# Patient Record
Sex: Female | Born: 1945
Health system: Southern US, Community
[De-identification: ages and names within clinical notes are randomized; demographics above are authoritative.]

## PROBLEM LIST (undated history)

## (undated) DIAGNOSIS — Z809 Family history of malignant neoplasm, unspecified: Secondary | ICD-10-CM

## (undated) DIAGNOSIS — Z8669 Personal history of other diseases of the nervous system and sense organs: Secondary | ICD-10-CM

## (undated) DIAGNOSIS — E785 Hyperlipidemia, unspecified: Secondary | ICD-10-CM

## (undated) DIAGNOSIS — T7840XA Allergy, unspecified, initial encounter: Secondary | ICD-10-CM

## (undated) DIAGNOSIS — M810 Age-related osteoporosis without current pathological fracture: Secondary | ICD-10-CM

## (undated) DIAGNOSIS — I4891 Unspecified atrial fibrillation: Secondary | ICD-10-CM

## (undated) DIAGNOSIS — H9190 Unspecified hearing loss, unspecified ear: Secondary | ICD-10-CM

## (undated) DIAGNOSIS — R51 Headache: Secondary | ICD-10-CM

## (undated) DIAGNOSIS — R519 Headache, unspecified: Secondary | ICD-10-CM

## (undated) HISTORY — PX: OTHER SURGICAL HISTORY: SHX169

## (undated) HISTORY — DX: Hyperlipidemia, unspecified: E78.5

## (undated) HISTORY — DX: Personal history of other diseases of the nervous system and sense organs: Z86.69

## (undated) HISTORY — DX: Family history of malignant neoplasm, unspecified: Z80.9

## (undated) HISTORY — DX: Allergy, unspecified, initial encounter: T78.40XA

## (undated) HISTORY — DX: Age-related osteoporosis without current pathological fracture: M81.0

## (undated) HISTORY — DX: Unspecified hearing loss, unspecified ear: H91.90

## (undated) HISTORY — DX: Unspecified atrial fibrillation: I48.91

---

## 1967-09-24 HISTORY — PX: TUBAL LIGATION: SHX77

## 1998-07-24 HISTORY — PX: ABDOMINAL HYSTERECTOMY: SHX81

## 2001-04-28 HISTORY — PX: OTHER SURGICAL HISTORY: SHX169

## 2001-06-08 ENCOUNTER — Encounter: Admission: RE | Admit: 2001-06-08 | Discharge: 2001-06-08 | Payer: Self-pay | Admitting: Family Medicine

## 2001-06-08 ENCOUNTER — Encounter: Payer: Self-pay | Admitting: Family Medicine

## 2001-06-08 ENCOUNTER — Other Ambulatory Visit: Admission: RE | Admit: 2001-06-08 | Discharge: 2001-06-08 | Payer: Self-pay | Admitting: Family Medicine

## 2001-06-08 LAB — CONVERTED CEMR LAB: Pap Smear: NORMAL

## 2002-08-16 ENCOUNTER — Encounter: Payer: Self-pay | Admitting: Family Medicine

## 2002-08-16 ENCOUNTER — Encounter: Admission: RE | Admit: 2002-08-16 | Discharge: 2002-08-16 | Payer: Self-pay | Admitting: Family Medicine

## 2004-07-02 ENCOUNTER — Ambulatory Visit: Payer: Self-pay | Admitting: Family Medicine

## 2004-07-05 ENCOUNTER — Ambulatory Visit: Payer: Self-pay | Admitting: Family Medicine

## 2004-11-19 ENCOUNTER — Emergency Department: Payer: Self-pay | Admitting: Emergency Medicine

## 2004-11-19 ENCOUNTER — Other Ambulatory Visit: Payer: Self-pay

## 2004-12-21 ENCOUNTER — Ambulatory Visit: Payer: Self-pay | Admitting: Family Medicine

## 2005-12-31 ENCOUNTER — Ambulatory Visit: Payer: Self-pay | Admitting: Family Medicine

## 2006-01-02 ENCOUNTER — Ambulatory Visit: Payer: Self-pay | Admitting: Family Medicine

## 2006-03-18 ENCOUNTER — Ambulatory Visit: Payer: Self-pay | Admitting: Family Medicine

## 2006-03-20 ENCOUNTER — Ambulatory Visit: Payer: Self-pay | Admitting: Family Medicine

## 2006-04-24 ENCOUNTER — Ambulatory Visit: Payer: Self-pay | Admitting: Family Medicine

## 2006-09-22 ENCOUNTER — Ambulatory Visit: Payer: Self-pay | Admitting: Family Medicine

## 2007-03-24 ENCOUNTER — Encounter: Payer: Self-pay | Admitting: Family Medicine

## 2007-03-24 DIAGNOSIS — G43909 Migraine, unspecified, not intractable, without status migrainosus: Secondary | ICD-10-CM | POA: Insufficient documentation

## 2007-03-24 DIAGNOSIS — J301 Allergic rhinitis due to pollen: Secondary | ICD-10-CM | POA: Insufficient documentation

## 2007-03-25 ENCOUNTER — Ambulatory Visit: Payer: Self-pay | Admitting: Family Medicine

## 2007-03-25 LAB — CONVERTED CEMR LAB
ALT: 16 units/L (ref 0–35)
AST: 16 units/L (ref 0–37)
Albumin: 3.7 g/dL (ref 3.5–5.2)
Alkaline Phosphatase: 71 units/L (ref 39–117)
BUN: 15 mg/dL (ref 6–23)
Bilirubin, Direct: 0.1 mg/dL (ref 0.0–0.3)
CO2: 32 meq/L (ref 19–32)
Calcium: 9.1 mg/dL (ref 8.4–10.5)
Chloride: 107 meq/L (ref 96–112)
Cholesterol: 208 mg/dL (ref 0–200)
Creatinine, Ser: 0.9 mg/dL (ref 0.4–1.2)
Direct LDL: 143 mg/dL
GFR calc Af Amer: 82 mL/min
GFR calc non Af Amer: 68 mL/min
Glucose, Bld: 96 mg/dL (ref 70–99)
HDL: 53.1 mg/dL (ref >39.0)
Potassium: 4.2 meq/L (ref 3.5–5.1)
Sodium: 142 meq/L (ref 135–145)
TSH: 3.38 microintl units/mL (ref 0.35–5.50)
Total Bilirubin: 1 mg/dL (ref 0.3–1.2)
Total CHOL/HDL Ratio: 3.9
Total Protein: 7 g/dL (ref 6.0–8.3)
Triglycerides: 85 mg/dL (ref 0–149)
VLDL: 17 mg/dL (ref 0–40)

## 2007-03-30 ENCOUNTER — Ambulatory Visit: Payer: Self-pay | Admitting: Family Medicine

## 2007-03-30 LAB — CONVERTED CEMR LAB
Bilirubin Urine: NEGATIVE
Glucose, Urine, Semiquant: NEGATIVE
Ketones, urine, test strip: NEGATIVE
Nitrite: NEGATIVE
Specific Gravity, Urine: 1.01
Urobilinogen, UA: 1
pH: 7

## 2007-04-01 ENCOUNTER — Encounter: Payer: Self-pay | Admitting: Family Medicine

## 2007-04-01 ENCOUNTER — Ambulatory Visit: Payer: Self-pay | Admitting: Family Medicine

## 2007-04-07 ENCOUNTER — Telehealth: Payer: Self-pay | Admitting: Family Medicine

## 2007-04-22 ENCOUNTER — Ambulatory Visit: Payer: Self-pay | Admitting: Family Medicine

## 2007-04-22 ENCOUNTER — Encounter: Payer: Self-pay | Admitting: Family Medicine

## 2007-04-23 ENCOUNTER — Encounter (INDEPENDENT_AMBULATORY_CARE_PROVIDER_SITE_OTHER): Payer: Self-pay | Admitting: *Deleted

## 2007-05-06 ENCOUNTER — Ambulatory Visit: Payer: Self-pay | Admitting: Family Medicine

## 2007-05-15 ENCOUNTER — Ambulatory Visit: Payer: Self-pay | Admitting: Family Medicine

## 2007-07-07 ENCOUNTER — Ambulatory Visit: Payer: Self-pay | Admitting: Family Medicine

## 2007-07-27 ENCOUNTER — Telehealth: Payer: Self-pay | Admitting: Family Medicine

## 2007-10-23 ENCOUNTER — Encounter: Payer: Self-pay | Admitting: Family Medicine

## 2007-10-23 ENCOUNTER — Ambulatory Visit: Payer: Self-pay | Admitting: Family Medicine

## 2007-12-14 ENCOUNTER — Ambulatory Visit: Payer: Self-pay | Admitting: Family Medicine

## 2007-12-16 ENCOUNTER — Telehealth: Payer: Self-pay | Admitting: Family Medicine

## 2007-12-17 ENCOUNTER — Encounter: Payer: Self-pay | Admitting: Family Medicine

## 2007-12-21 ENCOUNTER — Encounter: Payer: Self-pay | Admitting: Family Medicine

## 2007-12-21 ENCOUNTER — Other Ambulatory Visit: Payer: Self-pay

## 2007-12-21 ENCOUNTER — Telehealth: Payer: Self-pay | Admitting: Family Medicine

## 2007-12-21 ENCOUNTER — Emergency Department: Payer: Self-pay | Admitting: Unknown Physician Specialty

## 2007-12-21 DIAGNOSIS — R030 Elevated blood-pressure reading, without diagnosis of hypertension: Secondary | ICD-10-CM | POA: Insufficient documentation

## 2007-12-22 ENCOUNTER — Ambulatory Visit: Payer: Self-pay | Admitting: Family Medicine

## 2008-01-19 ENCOUNTER — Ambulatory Visit: Payer: Self-pay | Admitting: Otolaryngology

## 2008-02-03 ENCOUNTER — Encounter: Payer: Self-pay | Admitting: Family Medicine

## 2008-06-23 ENCOUNTER — Ambulatory Visit: Payer: Self-pay | Admitting: Family Medicine

## 2008-06-23 LAB — CONVERTED CEMR LAB
ALT: 25 units/L (ref 0–35)
AST: 28 units/L (ref 0–37)
Albumin: 3.9 g/dL (ref 3.5–5.2)
Alkaline Phosphatase: 72 units/L (ref 39–117)
BUN: 12 mg/dL (ref 6–23)
Basophils Absolute: 0 10*3/uL (ref 0.0–0.1)
Basophils Relative: 0.6 % (ref 0.0–3.0)
Bilirubin, Direct: 0.1 mg/dL (ref 0.0–0.3)
CO2: 32 meq/L (ref 19–32)
Calcium: 9.2 mg/dL (ref 8.4–10.5)
Chloride: 105 meq/L (ref 96–112)
Cholesterol: 240 mg/dL (ref 0–200)
Creatinine, Ser: 1 mg/dL (ref 0.4–1.2)
Direct LDL: 190.4 mg/dL
Eosinophils Absolute: 0.2 10*3/uL (ref 0.0–0.7)
Eosinophils Relative: 2.9 % (ref 0.0–5.0)
GFR calc Af Amer: 72 mL/min
GFR calc non Af Amer: 60 mL/min
Glucose, Bld: 98 mg/dL (ref 70–99)
HCT: 39.7 % (ref 36.0–46.0)
HDL: 46 mg/dL (ref >39.0)
Hemoglobin: 13.9 g/dL (ref 12.0–15.0)
Lymphocytes Relative: 36.4 % (ref 12.0–46.0)
MCHC: 34.9 g/dL (ref 30.0–36.0)
MCV: 83.3 fL (ref 78.0–100.0)
Monocytes Absolute: 0.4 10*3/uL (ref 0.1–1.0)
Monocytes Relative: 7.1 % (ref 3.0–12.0)
Neutro Abs: 2.8 10*3/uL (ref 1.4–7.7)
Neutrophils Relative %: 53 % (ref 43.0–77.0)
Platelets: 219 10*3/uL (ref 150–400)
Potassium: 4.2 meq/L (ref 3.5–5.1)
RBC: 4.77 M/uL (ref 3.87–5.11)
RDW: 12.9 % (ref 11.5–14.6)
Sodium: 144 meq/L (ref 135–145)
TSH: 2.93 microintl units/mL (ref 0.35–5.50)
Total Bilirubin: 1.4 mg/dL — ABNORMAL HIGH (ref 0.3–1.2)
Total CHOL/HDL Ratio: 5.2
Total Protein: 6.7 g/dL (ref 6.0–8.3)
Triglycerides: 94 mg/dL (ref 0–149)
VLDL: 19 mg/dL (ref 0–40)
WBC: 5.3 10*3/uL (ref 4.5–10.5)

## 2008-06-28 ENCOUNTER — Ambulatory Visit: Payer: Self-pay | Admitting: Family Medicine

## 2008-06-28 ENCOUNTER — Encounter: Payer: Self-pay | Admitting: Family Medicine

## 2008-06-28 ENCOUNTER — Other Ambulatory Visit: Admission: RE | Admit: 2008-06-28 | Discharge: 2008-06-28 | Payer: Self-pay | Admitting: Family Medicine

## 2008-06-30 ENCOUNTER — Encounter (INDEPENDENT_AMBULATORY_CARE_PROVIDER_SITE_OTHER): Payer: Self-pay | Admitting: *Deleted

## 2008-07-01 ENCOUNTER — Encounter (INDEPENDENT_AMBULATORY_CARE_PROVIDER_SITE_OTHER): Payer: Self-pay | Admitting: *Deleted

## 2008-07-01 ENCOUNTER — Ambulatory Visit: Payer: Self-pay | Admitting: Family Medicine

## 2008-08-01 ENCOUNTER — Encounter: Payer: Self-pay | Admitting: Family Medicine

## 2008-08-01 ENCOUNTER — Ambulatory Visit: Payer: Self-pay | Admitting: Family Medicine

## 2008-08-03 ENCOUNTER — Encounter (INDEPENDENT_AMBULATORY_CARE_PROVIDER_SITE_OTHER): Payer: Self-pay | Admitting: *Deleted

## 2009-01-17 ENCOUNTER — Ambulatory Visit: Payer: Self-pay | Admitting: Family Medicine

## 2009-02-28 ENCOUNTER — Ambulatory Visit: Payer: Self-pay | Admitting: Family Medicine

## 2009-06-29 ENCOUNTER — Ambulatory Visit: Payer: Self-pay | Admitting: Family Medicine

## 2009-06-29 LAB — CONVERTED CEMR LAB
ALT: 17 units/L (ref 0–35)
AST: 17 units/L (ref 0–37)
Albumin: 4.1 g/dL (ref 3.5–5.2)
Alkaline Phosphatase: 74 units/L (ref 39–117)
BUN: 12 mg/dL (ref 6–23)
Basophils Absolute: 0 10*3/uL (ref 0.0–0.1)
Basophils Relative: 0.4 % (ref 0.0–3.0)
Bilirubin, Direct: 0 mg/dL (ref 0.0–0.3)
CO2: 32 meq/L (ref 19–32)
Calcium: 11.8 mg/dL — ABNORMAL HIGH (ref 8.4–10.5)
Chloride: 118 meq/L — ABNORMAL HIGH (ref 96–112)
Cholesterol: 237 mg/dL — ABNORMAL HIGH (ref 0–200)
Creatinine, Ser: 1 mg/dL (ref 0.4–1.2)
Direct LDL: 192.9 mg/dL
Eosinophils Absolute: 0.1 10*3/uL (ref 0.0–0.7)
Eosinophils Relative: 2.7 % (ref 0.0–5.0)
GFR calc non Af Amer: 59.46 mL/min (ref >60)
Glucose, Bld: 87 mg/dL (ref 70–99)
HCT: 41.4 % (ref 36.0–46.0)
HDL: 41 mg/dL (ref >39.00)
Hemoglobin: 14 g/dL (ref 12.0–15.0)
Lymphocytes Relative: 33.7 % (ref 12.0–46.0)
Lymphs Abs: 1.7 10*3/uL (ref 0.7–4.0)
MCHC: 33.9 g/dL (ref 30.0–36.0)
MCV: 85.3 fL (ref 78.0–100.0)
Monocytes Absolute: 0.4 10*3/uL (ref 0.1–1.0)
Monocytes Relative: 7.8 % (ref 3.0–12.0)
Neutro Abs: 2.8 10*3/uL (ref 1.4–7.7)
Neutrophils Relative %: 55.4 % (ref 43.0–77.0)
Platelets: 222 10*3/uL (ref 150.0–400.0)
Potassium: 3.9 meq/L (ref 3.5–5.1)
RBC: 4.86 M/uL (ref 3.87–5.11)
RDW: 12.8 % (ref 11.5–14.6)
Sodium: 142 meq/L (ref 135–145)
TSH: 3.82 microintl units/mL (ref 0.35–5.50)
Total Bilirubin: 1.3 mg/dL — ABNORMAL HIGH (ref 0.3–1.2)
Total CHOL/HDL Ratio: 6
Total Protein: 7.4 g/dL (ref 6.0–8.3)
Triglycerides: 123 mg/dL (ref 0.0–149.0)
VLDL: 24.6 mg/dL (ref 0.0–40.0)
WBC: 5 10*3/uL (ref 4.5–10.5)

## 2009-07-17 ENCOUNTER — Telehealth: Payer: Self-pay | Admitting: Family Medicine

## 2009-08-21 ENCOUNTER — Encounter: Payer: Self-pay | Admitting: Family Medicine

## 2009-08-21 ENCOUNTER — Ambulatory Visit: Payer: Self-pay | Admitting: Family Medicine

## 2009-08-28 ENCOUNTER — Encounter (INDEPENDENT_AMBULATORY_CARE_PROVIDER_SITE_OTHER): Payer: Self-pay | Admitting: *Deleted

## 2009-09-20 LAB — HM MAMMOGRAPHY: HM Mammogram: NORMAL

## 2009-10-23 ENCOUNTER — Ambulatory Visit: Payer: Self-pay | Admitting: Family Medicine

## 2009-12-06 ENCOUNTER — Encounter (INDEPENDENT_AMBULATORY_CARE_PROVIDER_SITE_OTHER): Payer: Self-pay | Admitting: *Deleted

## 2009-12-06 ENCOUNTER — Ambulatory Visit: Payer: Self-pay | Admitting: Family Medicine

## 2009-12-06 LAB — FECAL OCCULT BLOOD, GUAIAC: Fecal Occult Blood: NEGATIVE

## 2009-12-06 LAB — CONVERTED CEMR LAB
OCCULT 1: NEGATIVE
OCCULT 2: NEGATIVE
OCCULT 3: NEGATIVE

## 2009-12-25 ENCOUNTER — Ambulatory Visit: Payer: Self-pay | Admitting: Family Medicine

## 2009-12-25 DIAGNOSIS — K5289 Other specified noninfective gastroenteritis and colitis: Secondary | ICD-10-CM | POA: Insufficient documentation

## 2009-12-27 ENCOUNTER — Telehealth: Payer: Self-pay | Admitting: Family Medicine

## 2010-05-01 ENCOUNTER — Encounter (INDEPENDENT_AMBULATORY_CARE_PROVIDER_SITE_OTHER): Payer: Self-pay | Admitting: *Deleted

## 2010-10-23 NOTE — Letter (Signed)
Summary: Results Follow up Letter  [place] [facility]  [address] [place], [place] [address]   Phone: [phone]  Fax: [phone]    12/06/2009 MRN: [id number]  [facility] [address] [place], [place]  [address]  Dear Ms. Crossin,  The following are the results of your recent test(s):  Test         Result    Pap Smear:        Normal _____  Not Normal _____ Comments: ______________________________________________________ Cholesterol: LDL(Bad cholesterol):         Your goal is less than:         HDL (Good cholesterol):       Your goal is more than: Comments:  ______________________________________________________ Mammogram:        Normal _____  Not Normal _____ Comments:  ___________________________________________________________________ Hemoccult:        Normal __X___  Not normal _______ Comments:  Please repeat in one year.  _____________________________________________________________________ Other Tests:    We routinely do not discuss normal results over the telephone.  If you desire a copy of the results, or you have any questions about this information we can discuss them at your next office visit.   Sincerely,     Laurita Quint, MD

## 2010-10-23 NOTE — Assessment & Plan Note (Signed)
Summary: CPX/RBH   Vital Signs:  Patient profile:   65 year old female Height:      65 inches Weight:      193.75 pounds BMI:     32.36 Temp:     97.6 degrees F oral Pulse rate:   72 / minute Pulse rhythm:   regular BP sitting:   144 / 84  (left arm) Cuff size:   large  Vitals Entered By: Sydell Axon LPN (October 23, 2009 2:56 PM) CC: 30 Minute checkup, hemoccult cards given to patient, declined Tetanus vaccine today, Preventive Care   History of Present Illness: Pt here for Comp Exam. She has pain in the left shoulder which has been going on since Nov. She has been taking   Preventive Screening-Counseling & Management  Alcohol-Tobacco     Alcohol drinks/day: 0     Smoking Status: quit     Year Quit: 1981     Pack years: 25     Passive Smoke Exposure: no  Caffeine-Diet-Exercise     Caffeine use/day: 3 coca colas     Does Patient Exercise: yes     Type of exercise: walks all time     Times/week: 5  Problems Prior to Update: 1)  Other Screening Mammogram  (ICD-V76.12) 2)  Elevated Bp Reading Without Dx Hypertension  (ICD-796.2) 3)  Unspecified Hearing Loss, L>r (DR BENNETT)  (ICD-389.9) 4)  Sprain/strain, Neck/ Trapezius  (ICD-847.0) 5)  Labyrinthitis, Acute  (ICD-386.30) 6)  Screening For Malignannt Neoplasm, Site Nec  (ICD-V76.49) 7)  Otitis Externa, Acute Nec  (ICD-380.22) 8)  Health Maintenance Exam  (ICD-V70.0) 9)  Migraines, Hx of  (ICD-V13.8) 10)  Allergic Rhinitis, Seasonal  (ICD-477.0) 11)  Hypercholesterolemia, 280  (ICD-272.0)  Medications Prior to Update: 1)  Pravastatin Sodium 20 Mg  Tabs (Pravastatin Sodium) .... Take One By Mouth At Bedtime 2)  Premarin 0.3 Mg  Tabs (Estrogens Conjugated) .... Take One Tab By Mouth Once Daily States Only Takes in The Summer Time 3)  Zyrtec 10 Mg  Tabs (Cetirizine Hcl) .... Take One By Mouth Daily 4)  Flonase 50 Mcg/act  Susp (Fluticasone Propionate) .Marland Kitchen.. 1 Inh Each Nostril At Bedtime 5)  Maxalt-Mlt 10 Mg  Tbdp  (Rizatriptan Benzoate) .... One Tab At Onset of H/a, May Repeat Once in Two Hours As Needed  Allergies: 1)  ! Percocet (Oxycodone-Acetaminophen) 2)  ! Prednisone (Prednisone)  Past History:  Past Surgical History: Last updated: 12/25/2007 NSVD x 2  [facility] 1969 MRI- Head, secondary H/A L-OCCIP wnl 11/00 Abd. U/S, uterine fibroid 09/98 Pelvic U/S 5cm fibroid 10/98 Partial Hysterectomy Ovaries Intact 12/27/97 Stress cardiolite wnl, EF 69% 04/28/01 Hysterectomy, ovaries intact for fibroids 11/99 Signif. FH cancer- 01 CT Head w/o   NML ([facility]) 12/21/07  Family History: Last updated: 2008/07/17 Father: Died at age 79 of pneumonia with melanoma Mother: Died at age 63 of congestive heart failure, lung cancer since her 43's with kidney cancer as well; she never smoked or drank, and she also had cancer of the throat and tongue Siblings: Three brothers, one sister  ovarian and brest cancer, and one half-sister.  Diabetes, Mother's side MI, PGF- 80's Depression, "nerves" MGF Stroke GM DM, mother, borderline  Social History: Last updated: 10/23/2009 Marital Status:Remarried Children: Two stepchildren and two children of her own Occupation: Retired  Personnel officer as an Film/video editor  Risk Factors: Alcohol Use: 0 (10/23/2009) Caffeine Use: 3 coca colas (10/23/2009) Exercise: yes (10/23/2009)  Risk Factors: Smoking  Status: quit (10/23/2009) Passive Smoke Exposure: no (10/23/2009)  Social History: Marital Status:Remarried Children: Two stepchildren and two children of her own Occupation: Retired  Personnel officer as an Film/video editor Caffeine use/day:  3 coca colas  Review of Systems General:  Denies chills, fatigue, fever, sweats, weakness, and weight loss. Eyes:  Denies blurring, discharge, eye pain, and itching. ENT:  Complains of decreased hearing and ringing in ears; denies earache; chronic. CV:  Denies chest pain or discomfort, fainting, fatigue,  palpitations, shortness of breath with exertion, and weight gain. Resp:  Denies cough, shortness of breath, and wheezing. GI:  Complains of constipation and indigestion; denies abdominal pain, bloody stools, change in bowel habits, dark tarry stools, diarrhea, loss of appetite, nausea, vomiting, vomiting blood, and yellowish skin color; mild. GU:  Complains of nocturia and urinary frequency; denies discharge and dysuria; chronically frequent. MS:  Complains of joint pain; denies low back pain, muscle aches, cramps, and stiffness; l shoulder. Derm:  Complains of dryness; denies itching and rash; chronic. Neuro:  Denies numbness, poor balance, tingling, and tremors.  Physical Exam  General:  Well-developed,well-nourished,in no acute distress; alert,appropriate and cooperative throughout examination Head:  Normocephalic and atraumatic without obvious abnormalities. No apparent alopecia or balding. Eyes:  Conjunctiva clear bilaterally.  Ears:  External ear exam shows no significant lesions or deformities.  Otoscopic examination reveals clear canals, tympanic membranes are intact bilaterally without bulging, retraction, inflammation or discharge. Hearing is mildly decreased  bilaterally, worse left than right.. Nose:  no external deformity and nasal dischargemucosal pallor.   Mouth:  Oral mucosa and oropharynx without lesions or exudates.   Neck:  No deformities, masses, or tenderness noted. Chest Wall:  No deformities, masses, or tenderness noted. Breasts:  No mass, nodules, thickening, tenderness, bulging, retraction, inflamation, nipple discharge or skin changes noted.   Lungs:  Normal respiratory effort, chest expands symmetrically. Lungs are clear to auscultation, no crackles or wheezes. Heart:  Normal rate and regular rhythm. S1 and S2 normal without gallop, murmur, click, rub or other extra sounds. Abdomen:  Bowel sounds positive,abdomen soft and non-tender without masses, organomegaly or  hernias noted. Rectal:  No external abnormalities noted. Normal sphincter tone. No rectal masses or tenderness. G  neg. Genitalia:  Bimanual only done Introitus wnl, Uterus and Cervix absent, Adnexa nontender w/o mass, ovaries not felt.  Msk:  No deformity or scoliosis noted of thoracic or lumbar spine.   Pulses:  R and L carotid,radial,femoral,dorsalis pedis and posterior tibial pulses are full and equal bilaterally Extremities:  No clubbing, cyanosis, edema, or deformity noted with normal full range of motion of all joints except left shoulder limited in all directions with discomfort in the prox body of the bicep.Marland Kitchen   Neurologic:  No cranial nerve deficits noted. Station and gait are normal. Sensory, motor and coordinative functions appear intact. Skin:  Intact without suspicious lesions or rashes Cervical Nodes:  No lymphadenopathy noted Axillary Nodes:  No palpable lymphadenopathy Inguinal Nodes:  No significant adenopathy Psych:  Cognition and judgment appear intact. Alert and cooperative with normal attention span and concentration. No apparent delusions, illusions, hallucinations   Impression & Recommendations:  Problem # 1:  HEALTH MAINTENANCE EXAM (ICD-V70.0) Declined Td.  Problem # 2:  ELEVATED BP READING WITHOUT DX HYPERTENSION (ICD-796.2) Recheck next time, "I have a lot going on making my BP high."  Problem # 3:  HYPERCHOLESTEROLEMIA, 280 (ICD-272.0) Assessment: Deteriorated Stopped her Pravastatin. Needs it. Will start back. Recheck in future. Her updated medication list  for this problem includes:    Pravachol 40 Mg Tabs (Pravastatin sodium) ..... One tab by mouth at night  Complete Medication List: 1)  Pravachol 40 Mg Tabs (Pravastatin sodium) .... One tab by mouth at night 2)  Premarin 0.3 Mg Tabs (Estrogens conjugated) .... Take one tab by mouth once daily states only takes in the summer time 3)  Zyrtec 10 Mg Tabs (Cetirizine hcl) .... Take one by mouth daily 4)   Flonase 50 Mcg/act Susp (Fluticasone propionate) .Marland Kitchen.. 1 inh each nostril at bedtime 5)  Maxalt-mlt 10 Mg Tbdp (Rizatriptan benzoate) .... One tab at onset of h/a, may repeat once in two hours as needed 6)  Flexeril 10 Mg Tabs (Cyclobenzaprine hcl) .... 1/2-1 tab by mouth three times a day  PAP Screening:    Last PAP smear:  06/28/2008  Mammogram Screening:    Last Mammogram:  09/20/2009  Osteoporosis Risk Assessment:  Risk Factors for Fracture or Low Bone Density:   Race (White or Asian):     yes   Smoking status:       quit  Immunization & Chemoprophylaxis:    Tetanus vaccine: Td  (10/11/1999)  Patient Instructions: 1)  RTC 2 mos for BP check. 2)  Chol prof, SGOT, SGPT prior 272.0 Prescriptions: PRAVACHOL 40 MG TABS (PRAVASTATIN SODIUM) one tab by mouth at night  #30 x 12   Entered and Authorized by:   Shaune Leeks MD   Signed by:   Shaune Leeks MD on 10/23/2009   Method used:   Electronically to        [facility]. [address] [phone]* (retail)       [address] [place], [place]  [id number]       Ph: [phone]       Fax: [phone]   RxID:   [id number] FLEXERIL 10 MG TABS (CYCLOBENZAPRINE HCL) 1/2-1 tab by mouth three times a day  #50 x 0   Entered and Authorized by:   Shaune Leeks MD   Signed by:   Shaune Leeks MD on 10/23/2009   Method used:   Electronically to        [facility]. [address] [phone]* (retail)       [address] [place], [place]  [id number]       Ph: [phone]       Fax: [phone]   RxID:   [id number]   Current Allergies (reviewed today): ! PERCOCET (OXYCODONE-ACETAMINOPHEN) ! PREDNISONE (PREDNISONE)

## 2010-10-23 NOTE — Letter (Signed)
Summary: Nadara Eaton letter  [place] at [facility]  [address] [place], [place] [address]   Phone: [phone]  Fax: [phone]       05/01/2010 MRN: [id number]  [facility] [address] [place], [place]  [address]  Dear Ms. [facility] [place] and [place] announce the retirement of Arta Silence, M.D., from full-time practice at the [facility] office effective March 22, 2010 and his plans of returning part-time.  It is important to Dr. Hetty Ely and to our practice that you understand that [facility] has seven physicians in our office for your health care needs.  We will continue to offer the same exceptional care that you have today.    Dr. Hetty Ely has spoken to many of you about his plans for retirement and returning part-time in the fall.   We will continue to work with you through the transition to schedule appointments for you in the office and meet the high standards that [facility] is committed to.   Again, it is with great pleasure that we share the news that Dr. Hetty Ely will return to [facility] at [facility] in October of 2011 with a reduced schedule.    If you have any questions, or would like to request an appointment with one of our physicians, please call us at [phone] and press the option for Scheduling an appointment.  We take pleasure in providing you with excellent patient care and look forward to seeing you at your next office visit.  Our [facility] Physicians are:  Tillman Abide, M.D. Laurita Quint, M.D. Roxy Manns, M.D. Kerby Nora, M.D. Hannah Beat, M.D. Ruthe Mannan, M.D. We proudly welcomed Raechel Ache, M.D. and Eustaquio Boyden, M.D. to the practice in July/August 2011.  Sincerely,  [facility]

## 2010-10-23 NOTE — Assessment & Plan Note (Signed)
Summary: coughing,aches,fever/alc   Vital Signs:  Patient profile:   65 year old female Weight:      190 pounds Temp:     98.6 degrees F oral Pulse rate:   84 / minute Pulse rhythm:   regular BP sitting:   160 / 100  (left arm) Cuff size:   large  Vitals Entered By: Sydell Axon LPN (December 26, 930 2:13 PM) CC: Runny nose, cough, aches, fever and vomitting   History of Present Illness: Pt here for feeling bad. She is achey which started Sat PM. Has gotten worse as the time has gone on. She has not had diarrhea but started vomitting yesterday afternoon. She has kept down a small amt of coca cola. Her nose burns and used Neti Pot yest twice with purulent discharge both times.  Her BP remains high.  She has fever to  101. and feels hot now. Her back chest and stonmach hurts. Her ears hurt, she had headache and took migraine medicine yesterday. She has rhinitis, but no ST. She has an =acid feeling in the thraot, she thinks probably from her vomitting. She is minimally coughing but is sneezing and wheezing.  She has taken nothing except used crackers and water yeaterday.  Problems Prior to Update: 1)  Other Screening Mammogram  (ICD-V76.12) 2)  Elevated Bp Reading Without Dx Hypertension  (ICD-796.2) 3)  Unspecified Hearing Loss, L>r (DR BENNETT)  (ICD-389.9) 4)  Sprain/strain, Neck/ Trapezius  (ICD-847.0) 5)  Screening For Malignannt Neoplasm, Site Nec  (ICD-V76.49) 6)  Health Maintenance Exam  (ICD-V70.0) 7)  Migraines, Hx of  (ICD-V13.8) 8)  Allergic Rhinitis, Seasonal  (ICD-477.0) 9)  Hypercholesterolemia, 280  (ICD-272.0)  Medications Prior to Update: 1)  Pravachol 40 Mg Tabs (Pravastatin Sodium) .... One Tab By Mouth At Night 2)  Premarin 0.3 Mg  Tabs (Estrogens Conjugated) .... Take One Tab By Mouth Once Daily States Only Takes in The Summer Time 3)  Zyrtec 10 Mg  Tabs (Cetirizine Hcl) .... Take One By Mouth Daily 4)  Flonase 50 Mcg/act  Susp (Fluticasone Propionate) .Marland Kitchen.. 1  Inh Each Nostril At Bedtime 5)  Maxalt-Mlt 10 Mg  Tbdp (Rizatriptan Benzoate) .... One Tab At Onset of H/a, May Repeat Once in Two Hours As Needed 6)  Flexeril 10 Mg Tabs (Cyclobenzaprine Hcl) .... 1/2-1 Tab By Mouth Three Times A Day  Allergies: 1)  ! Percocet (Oxycodone-Acetaminophen) 2)  ! Prednisone (Prednisone) 3)  ! Imitrex (Sumatriptan)  Physical Exam  General:  Well-developed,well-nourished,in no acute distress; alert,appropriate and cooperative throughout examination, nauseated and laying on the table, gait ok on leaving. Head:  Normocephalic and atraumatic without obvious abnormalities. No apparent alopecia or balding. Sinuses minimally tender in max area. Eyes:  Conjunctiva clear bilaterally.  Ears:  External ear exam shows no significant lesions or deformities.  Otoscopic examination reveals clear canals, tympanic membranes are intact bilaterally without bulging, retraction, inflammation or discharge. Hearing is mildly decreased  bilaterally, worse left than right.. Nose:  no external deformity and nasal dischargemucosal pallor.   Mouth:  Oral mucosa and oropharynx without lesions or exudates.   Neck:  No deformities, masses, or tenderness noted. Lungs:  Normal respiratory effort, chest expands symmetrically. Lungs are clear to auscultation, no crackles or wheezes. Heart:  Normal rate and regular rhythm. S1 and S2 normal without gallop, murmur, click, rub or other extra sounds. Abdomen:  Bowel sounds positive,abdomen soft and non-tender without masses, organomegaly or hernias noted.   Impression & Recommendations:  Problem # 1:  GASTROENTERITIS (ICD-558.9) Assessment New Use Phenergan as needed for Nausea. This appears to be viral in presentation. See instructions.   Complete Medication List: 1)  Pravachol 40 Mg Tabs (Pravastatin sodium) .... One tab by mouth at night 2)  Maxalt-mlt 10 Mg Tbdp (Rizatriptan benzoate) .... One tab at onset of h/a, may repeat once in two  hours as needed 3)  Promethazine Hcl 25 Mg Tabs (Promethazine hcl) .... One tab by mouth every 6 hrs as needed nausea.  Patient Instructions: 1)  Take promethazine as needed nausea. 2)  Stay on clear liquids. thru tomm. 3)  Then BRAT. 4)  Avoid milk and milk prods for one week. 5)  Call with sxs Wed.  6)  Take Guaifenesin by going to [facility] or [facility] and getting MUCOUS RELIEF EXPECTORANT (400mg ), take 11/2 tabs by mouth AM and NOON. 7)  Drink lots of fluids anytime taking Guaifenesin.  8)  Tyl ES 2 tabs by mouth three times a day when able to keep things down.  9)  Need to asess Chol and probably treat BP next time, soon. Prescriptions: PROMETHAZINE HCL 25 MG TABS (PROMETHAZINE HCL) one tab by mouth every 6 hrs as needed nausea.  #20 x 0   Entered and Authorized by:   Shaune Leeks MD   Signed by:   Shaune Leeks MD on 12/25/2009   Method used:   Electronically to        [facility]. [address] [phone]* (retail)       [address] [place], [place]  [id number]       Ph: [phone]       Fax: [phone]   RxID:   [id number]   Current Allergies (reviewed today): ! PERCOCET (OXYCODONE-ACETAMINOPHEN) ! PREDNISONE (PREDNISONE) ! IMITREX (SUMATRIPTAN)  Appended Document: coughing,aches,fever/alc   Medication Administration  Injection # 1:    Medication: Promethazine up to 50mg     Diagnosis: GASTROENTERITIS (ICD-558.9)    Route: IM    Site: RUOQ gluteus    Exp Date: 01/22/2011    Lot #: [id number]    Mfr: NOVAPLUS    Comments: 25mg . given per Dr. Hetty Ely    Patient tolerated injection without complications    Given by: Sydell Axon LPN (December 26, 7614 3:12 PM)  Orders Added: 1)  Promethazine up to 50mg  [J2550] 2)  Admin of Therapeutic Inj  intramuscular or subcutaneous [07371]

## 2010-10-23 NOTE — Progress Notes (Signed)
Summary: Patient is feeling a little better  Phone Note Call from Patient Call back at Home Phone [phone]   Caller: Patient Call For: Shaune Leeks MD Summary of Call: Patient says she is feeling a little better. No nausea, she is able to eat.  Her stomach is still a little sore but she is doing a little better.   Initial call taken by: Linde Gillis CMA Duncan Dull),  December 27, 2009 11:20 AM  Follow-up for Phone Call        Noted. Follow-up by: Shaune Leeks MD,  December 27, 2009 1:47 PM

## 2011-02-23 ENCOUNTER — Encounter: Payer: Self-pay | Admitting: Family Medicine

## 2011-03-01 ENCOUNTER — Emergency Department: Payer: Self-pay | Admitting: Emergency Medicine

## 2011-03-22 ENCOUNTER — Other Ambulatory Visit (INDEPENDENT_AMBULATORY_CARE_PROVIDER_SITE_OTHER): Payer: Medicare Other

## 2011-03-22 DIAGNOSIS — E7800 Pure hypercholesterolemia, unspecified: Secondary | ICD-10-CM

## 2011-03-22 DIAGNOSIS — R030 Elevated blood-pressure reading, without diagnosis of hypertension: Secondary | ICD-10-CM

## 2011-03-22 DIAGNOSIS — J309 Allergic rhinitis, unspecified: Secondary | ICD-10-CM

## 2011-03-22 DIAGNOSIS — IMO0001 Reserved for inherently not codable concepts without codable children: Secondary | ICD-10-CM

## 2011-03-22 LAB — LIPID PANEL
Cholesterol: 289 mg/dL — ABNORMAL HIGH (ref 0–200)
Triglycerides: 119 mg/dL (ref 0.0–149.0)

## 2011-03-22 LAB — HEPATIC FUNCTION PANEL
ALT: 15 U/L (ref 0–35)
Total Protein: 6.7 g/dL (ref 6.0–8.3)

## 2011-03-22 LAB — CBC WITH DIFFERENTIAL/PLATELET
Basophils Relative: 0.4 % (ref 0.0–3.0)
Hemoglobin: 13.9 g/dL (ref 12.0–15.0)
Lymphocytes Relative: 38.7 % (ref 12.0–46.0)
Monocytes Relative: 8.4 % (ref 3.0–12.0)
Neutro Abs: 2.5 10*3/uL (ref 1.4–7.7)
RBC: 4.81 Mil/uL (ref 3.87–5.11)

## 2011-03-22 LAB — BASIC METABOLIC PANEL
BUN: 16 mg/dL (ref 6–23)
CO2: 30 mEq/L (ref 19–32)
Chloride: 109 mEq/L (ref 96–112)
Creatinine, Ser: 0.9 mg/dL (ref 0.4–1.2)

## 2011-03-22 LAB — TSH: TSH: 3.03 u[IU]/mL (ref 0.35–5.50)

## 2011-03-29 ENCOUNTER — Encounter: Payer: Self-pay | Admitting: Family Medicine

## 2011-03-29 ENCOUNTER — Ambulatory Visit (INDEPENDENT_AMBULATORY_CARE_PROVIDER_SITE_OTHER): Payer: Medicare Other | Admitting: Family Medicine

## 2011-03-29 VITALS — BP 138/80 | HR 72 | Temp 97.1°F | Ht 65.0 in | Wt 190.5 lb

## 2011-03-29 DIAGNOSIS — R030 Elevated blood-pressure reading, without diagnosis of hypertension: Secondary | ICD-10-CM

## 2011-03-29 DIAGNOSIS — S40022A Contusion of left upper arm, initial encounter: Secondary | ICD-10-CM | POA: Insufficient documentation

## 2011-03-29 DIAGNOSIS — J301 Allergic rhinitis due to pollen: Secondary | ICD-10-CM

## 2011-03-29 DIAGNOSIS — Z0000 Encounter for general adult medical examination without abnormal findings: Secondary | ICD-10-CM

## 2011-03-29 DIAGNOSIS — Z23 Encounter for immunization: Secondary | ICD-10-CM

## 2011-03-29 DIAGNOSIS — E7800 Pure hypercholesterolemia, unspecified: Secondary | ICD-10-CM

## 2011-03-29 DIAGNOSIS — S40029A Contusion of unspecified upper arm, initial encounter: Secondary | ICD-10-CM

## 2011-03-29 MED ORDER — ROSUVASTATIN CALCIUM 20 MG PO TABS: 20.0000 mg | ORAL_TABLET | Freq: Every day | ORAL | Status: DC

## 2011-03-29 NOTE — Patient Instructions (Addendum)
SGOT, SGPT 272.4    6 WEEKS See me in 3 mos, SGOT, SGPT, CHOL PROFILE  272.4     Prior Pneumovax next time. Tdap Today.

## 2011-03-29 NOTE — Assessment & Plan Note (Signed)
Stable and acceptable today. BP Readings from Last 3 Encounters:  03/29/11 138/80  12/25/09 160/100  10/23/09 144/84

## 2011-03-29 NOTE — Assessment & Plan Note (Signed)
Healing without sign of infection. Will give Tdap.

## 2011-03-29 NOTE — Assessment & Plan Note (Signed)
Stable

## 2011-03-29 NOTE — Assessment & Plan Note (Addendum)
Only significant active problem she has. Will use Crestor if insur will cover, Lipitor next preferred and then Simva, then Prava. Crestor script sent in. Lab Results  Component Value Date   CHOL 289* 03/22/2011   CHOL 237* 06/29/2009   CHOL 240* 06/23/2008   Lab Results  Component Value Date   HDL 47.70 03/22/2011   HDL 81.19 06/29/2009   HDL 14.7 06/23/2008   No results found for this basename: LDLCALC   Lab Results  Component Value Date   TRIG 119.0 03/22/2011   TRIG 123.0 06/29/2009   TRIG 94 06/23/2008   Lab Results  Component Value Date   CHOLHDL 6 03/22/2011   CHOLHDL 6 06/29/2009   CHOLHDL 5.2 CALC 06/23/2008   Lab Results  Component Value Date   LDLDIRECT 243.0 03/22/2011   LDLDIRECT 192.9 06/29/2009   LDLDIRECT 190.4 06/23/2008

## 2011-03-29 NOTE — Progress Notes (Signed)
Subjective:    Patient ID: Rebecca Moran, female    DOB: [birth year], 65 y.o.   MRN: [id number]  HPI Pt here for Comp Exam. She feels well and has no complaints except for falling over her own two feet. She has been without insurance for the last year and hasn't been on meds. No chol Medication. She has recently tripped and broke her left wrist with hairline fracture, braced.     Review of Systems  Constitutional: Negative for fever, chills, diaphoresis, fatigue and unexpected weight change.  HENT: Positive for hearing loss (left hearing loss since 2009, occas has ringing. ). Negative for ear pain, rhinorrhea, trouble swallowing and tinnitus.   Eyes: Negative for pain, discharge and visual disturbance.  Respiratory: Negative for cough, shortness of breath and wheezing.   Cardiovascular: Negative for chest pain, palpitations and leg swelling.       No Fainting or Fatigue.  Gastrointestinal: Negative for nausea, vomiting, abdominal pain, diarrhea, constipation and blood in stool.       Occas  Heartburn based on food intake.  Genitourinary: Negative for dysuria and frequency.       Occas nocturia.   Musculoskeletal: Negative for myalgias, back pain and arthralgias.       Recent left wrist fracture.  Skin: Negative for rash.       No Itching or Dryness.  Neurological: Negative for tremors and numbness.       No Tingling. No Balance Problems.  Hematological: Negative for adenopathy. Does not bruise/bleed easily.  Psychiatric/Behavioral: Negative for dysphoric mood and agitation.       Objective:   Physical Exam  Constitutional: She is oriented to person, place, and time. She appears well-developed and well-nourished. No distress.  HENT:  Head: Normocephalic and atraumatic.  Left Ear: External ear normal.  Nose: Nose normal.  Mouth/Throat: Oropharynx is clear and moist. No oropharyngeal exudate.  Eyes: Conjunctivae and EOM are normal. Pupils are equal, round, and reactive to  light. No scleral icterus.  Neck: Normal range of motion. Neck supple. No thyromegaly present.  Cardiovascular: Normal rate, regular rhythm and normal heart sounds.  Exam reveals no friction rub.   No murmur heard. Pulmonary/Chest: Effort normal and breath sounds normal. No respiratory distress. She has no wheezes. She has no rales.       Breasts NT w/o mass, no nipple discharge, skin changes or axillary adenopathy  Abdominal: Soft. Bowel sounds are normal. She exhibits no mass. There is no tenderness.  Genitourinary:       Not done.  Musculoskeletal: Normal range of motion. She exhibits no edema and no tenderness.  Lymphadenopathy:    She has no cervical adenopathy.  Neurological: She is alert and oriented to person, place, and time. She has normal reflexes.  Skin: Skin is warm and dry. No rash noted. She is not diaphoretic. No erythema.  Psychiatric: She has a normal mood and affect. Her behavior is normal. Judgment and thought content normal.          Assessment & Plan:  HMPE  I have personally reviewed the Medicare Annual Wellness questionnaire and have noted 1. The patient's medical and social history 2. Their use of alcohol, tobacco or illicit drugs 3. Their current medications and supplements 4. The patient's functional ability including ADL's, fall risks, home safety risks and hearing or visual             Impairment. 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 5. Diet and  physical activities 6. Evidence for depression or mood disorders

## 2011-05-01 ENCOUNTER — Other Ambulatory Visit: Payer: Self-pay | Admitting: Family Medicine

## 2011-05-01 ENCOUNTER — Ambulatory Visit: Payer: Self-pay | Admitting: Family Medicine

## 2011-05-01 DIAGNOSIS — E7800 Pure hypercholesterolemia, unspecified: Secondary | ICD-10-CM

## 2011-05-02 ENCOUNTER — Encounter: Payer: Self-pay | Admitting: *Deleted

## 2011-05-10 ENCOUNTER — Other Ambulatory Visit (INDEPENDENT_AMBULATORY_CARE_PROVIDER_SITE_OTHER): Payer: Medicare Other | Admitting: Family Medicine

## 2011-05-10 DIAGNOSIS — E7800 Pure hypercholesterolemia, unspecified: Secondary | ICD-10-CM

## 2011-06-19 ENCOUNTER — Ambulatory Visit (INDEPENDENT_AMBULATORY_CARE_PROVIDER_SITE_OTHER): Payer: Medicare Other | Admitting: Family Medicine

## 2011-06-19 ENCOUNTER — Encounter: Payer: Self-pay | Admitting: Family Medicine

## 2011-06-19 VITALS — BP 110/74 | HR 60 | Temp 97.7°F | Wt 192.0 lb

## 2011-06-19 DIAGNOSIS — J069 Acute upper respiratory infection, unspecified: Secondary | ICD-10-CM | POA: Insufficient documentation

## 2011-06-19 MED ORDER — AMOXICILLIN 500 MG PO CAPS: 1000.0000 mg | ORAL_CAPSULE | Freq: Two times a day (BID) | ORAL | Status: AC

## 2011-06-19 NOTE — Patient Instructions (Signed)
Take Amox for ten days twice a day. Take Guaifenesin (400mg ), take 11/2 tabs by mouth AM and NOON. Get GUAIFENESIN by  [facility] or [facility] and getting MUCOUS RELIEF EXPECTORANT/CONGESTION. DO NOT GET MUCINEX (Timed Release Guaifenesin)  Drink lots of fluids. Take Zyrtec twice a day. Keep lozenge in mouth for a week.

## 2011-06-19 NOTE — Progress Notes (Signed)
  Subjective:    Patient ID: Rebecca Moran, female    DOB: [birth year], 65 y.o.   MRN: [id number]  HPI Pt here as acute appt for congestion, right ear pain and sinus drainage. She has no documented fever but has had chills. She has headache globally. Her right ear hurts and does not use drops because she used them years ago and couldn't hear after that. She continues to not be able to hear from the left. She has rhinitis with congestion, productive of green mucous with use of Neti pot and irrigation. She denies ST but has PND and has cough that is productive of green sputum that is occas clear as well and worse at night. . She has been around lots in the family who have had this. The worst of these is coughing.    Review of SystemsNoncontributory except as above.       Objective:   Physical Exam  Constitutional: She appears well-developed and well-nourished. No distress.  HENT:  Head: Normocephalic and atraumatic.  Right Ear: External ear normal.  Left Ear: External ear normal.  Nose: Nose normal.  Mouth/Throat: Oropharynx is clear and moist. No oropharyngeal exudate.  Eyes: Conjunctivae and EOM are normal. Pupils are equal, round, and reactive to light.  Neck: Normal range of motion. Neck supple. No thyromegaly present.       Mild nasal congestion.  Cardiovascular: Normal rate, regular rhythm and normal heart sounds.   Pulmonary/Chest: Effort normal and breath sounds normal. She has no wheezes. She has no rales.       Mild ronchi bilat.  Lymphadenopathy:    She has no cervical adenopathy.  Skin: She is not diaphoretic.          Assessment & Plan:

## 2011-06-19 NOTE — Assessment & Plan Note (Signed)
See instructions

## 2011-06-28 ENCOUNTER — Other Ambulatory Visit: Payer: Self-pay | Admitting: Family Medicine

## 2011-06-28 DIAGNOSIS — E7800 Pure hypercholesterolemia, unspecified: Secondary | ICD-10-CM

## 2011-07-01 ENCOUNTER — Other Ambulatory Visit: Payer: Medicare Other

## 2011-07-02 ENCOUNTER — Other Ambulatory Visit (INDEPENDENT_AMBULATORY_CARE_PROVIDER_SITE_OTHER): Payer: Medicare Other

## 2011-07-02 DIAGNOSIS — E7800 Pure hypercholesterolemia, unspecified: Secondary | ICD-10-CM

## 2011-07-02 LAB — ALT: ALT: 12 U/L (ref 0–35)

## 2011-07-02 LAB — LIPID PANEL
HDL: 48.9 mg/dL (ref >39.00)
Total CHOL/HDL Ratio: 3

## 2011-07-02 LAB — AST: AST: 17 U/L (ref 0–37)

## 2011-07-03 ENCOUNTER — Encounter: Payer: Self-pay | Admitting: Family Medicine

## 2011-07-03 ENCOUNTER — Ambulatory Visit (INDEPENDENT_AMBULATORY_CARE_PROVIDER_SITE_OTHER): Payer: Medicare Other | Admitting: Family Medicine

## 2011-07-03 DIAGNOSIS — J069 Acute upper respiratory infection, unspecified: Secondary | ICD-10-CM

## 2011-07-03 DIAGNOSIS — E7800 Pure hypercholesterolemia, unspecified: Secondary | ICD-10-CM

## 2011-07-03 NOTE — Progress Notes (Signed)
  Subjective:    Patient ID: Rebecca Moran, female    DOB: 1946/01/15, 65 y.o.   MRN: [id number]  HPI Pt here for three month recheck of Chol after starting Crestor 20mg . She is tolerating it well since taking it after evening meal for indigestion if took it in the evening. She is active and feels well.  She has sinus congestion still today. She is mostly congested nasally. She uses a Neti pot regularly. The family has passed this around recently with various members being on antibiotics in the last few weeks. Her daughter was put on antibiotics and had a neb trmt just yesterday. She has tolerated the Crestor well without any problems.    Review of SystemsNoncontributory except as above.       Objective:   Physical Exam  Constitutional: She appears well-developed and well-nourished. No distress.  HENT:  Head: Normocephalic and atraumatic.  Right Ear: External ear normal.  Left Ear: External ear normal.  Nose: Nose normal.  Mouth/Throat: Oropharynx is clear and moist. No oropharyngeal exudate.       Sinuses mildly tender in the max distribution.  Eyes: Conjunctivae and EOM are normal. Pupils are equal, round, and reactive to light.  Neck: Normal range of motion. Neck supple. No thyromegaly present.  Cardiovascular: Normal rate, regular rhythm and normal heart sounds.   Pulmonary/Chest: Effort normal and breath sounds normal. She has no wheezes. She has no rales.  Lymphadenopathy:    She has no cervical adenopathy.  Skin: She is not diaphoretic.          Assessment & Plan:

## 2011-07-03 NOTE — Patient Instructions (Signed)
Use VEramist one squirt each nostril daily until gone (given sample). RTC if symptoms worsen.

## 2011-07-03 NOTE — Assessment & Plan Note (Signed)
Improved with good nos on Crestor 20mg . Cont curr meds. May try Co Q 10. Lab Results  Component Value Date   CHOL 151 07/02/2011   HDL 48.90 07/02/2011   LDLCALC 86 07/02/2011   LDLDIRECT 243.0 03/22/2011   TRIG 79.0 07/02/2011   CHOLHDL 3 07/02/2011

## 2011-07-03 NOTE — Assessment & Plan Note (Signed)
Improved but with continued nasal congestion. Suggest trying Veramyst once a day after the [facility] followed by small amt of Vaseline in the nostril to preclude cracking and bleeding.

## 2011-07-04 ENCOUNTER — Ambulatory Visit: Payer: Medicare Other | Admitting: Family Medicine

## 2011-07-17 ENCOUNTER — Telehealth: Payer: Self-pay | Admitting: Radiology

## 2011-07-17 NOTE — Telephone Encounter (Signed)
[facility] notified us that this patient never returned the ifob stool kit. [facility] will bill them $5.27 for the kit. 

## 2011-07-17 NOTE — Telephone Encounter (Signed)
Noted  

## 2011-11-18 ENCOUNTER — Telehealth: Payer: Self-pay | Admitting: Family Medicine

## 2011-11-18 MED ORDER — PRAVASTATIN SODIUM 40 MG PO TABS: 40.0000 mg | ORAL_TABLET | Freq: Every evening | ORAL | Status: DC

## 2011-11-18 NOTE — Telephone Encounter (Signed)
Per patient, intolerant of crestor.  Will change to pravastatin and have her f/u in the clinic.  She agrees .

## 2012-06-22 ENCOUNTER — Other Ambulatory Visit: Payer: Self-pay | Admitting: Family Medicine

## 2012-06-22 DIAGNOSIS — E7800 Pure hypercholesterolemia, unspecified: Secondary | ICD-10-CM

## 2012-06-26 ENCOUNTER — Other Ambulatory Visit (INDEPENDENT_AMBULATORY_CARE_PROVIDER_SITE_OTHER): Payer: Medicare Other

## 2012-06-26 DIAGNOSIS — E7800 Pure hypercholesterolemia, unspecified: Secondary | ICD-10-CM

## 2012-06-26 LAB — COMPREHENSIVE METABOLIC PANEL
ALT: 15 U/L (ref 0–35)
AST: 20 U/L (ref 0–37)
Albumin: 3.9 g/dL (ref 3.5–5.2)
Alkaline Phosphatase: 73 U/L (ref 39–117)
BUN: 16 mg/dL (ref 6–23)
Calcium: 9.2 mg/dL (ref 8.4–10.5)
Chloride: 108 mEq/L (ref 96–112)
Creatinine, Ser: 1 mg/dL (ref 0.4–1.2)
Potassium: 4.4 mEq/L (ref 3.5–5.1)

## 2012-06-26 LAB — LIPID PANEL
HDL: 47.1 mg/dL (ref >39.00)
LDL Cholesterol: 125 mg/dL — ABNORMAL HIGH (ref 0–99)
Total CHOL/HDL Ratio: 4
Triglycerides: 109 mg/dL (ref 0.0–149.0)

## 2012-07-01 ENCOUNTER — Ambulatory Visit: Payer: Self-pay | Admitting: Family Medicine

## 2012-07-02 ENCOUNTER — Encounter: Payer: Self-pay | Admitting: Family Medicine

## 2012-07-03 ENCOUNTER — Ambulatory Visit (INDEPENDENT_AMBULATORY_CARE_PROVIDER_SITE_OTHER): Payer: Medicare Other | Admitting: Family Medicine

## 2012-07-03 ENCOUNTER — Encounter: Payer: Self-pay | Admitting: *Deleted

## 2012-07-03 ENCOUNTER — Encounter: Payer: Self-pay | Admitting: Family Medicine

## 2012-07-03 ENCOUNTER — Telehealth: Payer: Self-pay | Admitting: Family Medicine

## 2012-07-03 VITALS — BP 110/84 | HR 57 | Temp 97.5°F | Wt 196.0 lb

## 2012-07-03 DIAGNOSIS — Z23 Encounter for immunization: Secondary | ICD-10-CM

## 2012-07-03 DIAGNOSIS — H9190 Unspecified hearing loss, unspecified ear: Secondary | ICD-10-CM

## 2012-07-03 DIAGNOSIS — Z87898 Personal history of other specified conditions: Secondary | ICD-10-CM

## 2012-07-03 DIAGNOSIS — Z0000 Encounter for general adult medical examination without abnormal findings: Secondary | ICD-10-CM

## 2012-07-03 DIAGNOSIS — E7800 Pure hypercholesterolemia, unspecified: Secondary | ICD-10-CM

## 2012-07-03 DIAGNOSIS — Z1211 Encounter for screening for malignant neoplasm of colon: Secondary | ICD-10-CM

## 2012-07-03 DIAGNOSIS — Z780 Asymptomatic menopausal state: Secondary | ICD-10-CM

## 2012-07-03 MED ORDER — PRAVASTATIN SODIUM 40 MG PO TABS: 40.0000 mg | ORAL_TABLET | Freq: Every day | ORAL | Status: DC

## 2012-07-03 NOTE — Patient Instructions (Addendum)
Go to the lab on the way out.  We'll contact you with your lab report. See Shirlee Limerick about your referral before you leave today about the bone density test.   Call the eye clinic about getting checked.  Keep working on your diet and keep exercising.  Don't change your meds.   When you are running low on maxalt and need another rx, we can either send maxalt or you can let us know about other covered options.   I would get a pneumonia shot at some point in the future.   Take care.

## 2012-07-03 NOTE — Progress Notes (Signed)
I have personally reviewed the Medicare Annual Wellness questionnaire and have noted 1. The patient's medical and social history 2. Their use of alcohol, tobacco or illicit drugs 3. Their current medications and supplements 4. The patient's functional ability including ADL's, fall risks, home safety risks and hearing or visual             impairment. 5. Diet and physical activities 6. Evidence for depression or mood disorders  The patients weight, height, BMI have been recorded in the chart and visual acuity is per eye clinic.  I have made referrals, counseling and provided education to the patient based review of the above and I have provided the pt with a written personalized care plan for preventive services.  See scanned forms.  Routine anticipatory guidance given to patient.  See health maintenance. Tetanus 2012 Flu 2013 Shingles d/w pt PNA deferred by patient D/w patient [id number] for colon cancer screening, including IFOB vs. colonoscopy.  Risks and benefits of both were discussed and patient voiced understanding.  Pt elects YQM:VHQI mammo 07/01/12  Elevated Cholesterol: Using medications without problems:yes Muscle aches: no Diet compliance:yes Exercise:yes  Migraine.  Does well with maxalt.  Rare HA now, 1-2 per year.  May need to change med due to finances.  She has enough maxalt for now.   Hearing loss. Longstanding L ear.  Asking for referral for retesting, consideration on hearing aid.    PMH and SH reviewed  Meds, vitals, and allergies reviewed.   ROS: See HPI.  Otherwise negative.    GEN: nad, alert and oriented HEENT: mucous membranes moist, TM wnl x2, hard of hearing NECK: supple w/o LA CV: rrr. PULM: ctab, no inc wob ABD: soft, +bs EXT: no edema SKIN: no acute rash Breast exam: No mass, nodules, thickening, tenderness, bulging, retraction, inflamation, nipple discharge or skin changes noted.  No axillary or clavicular LA.  Chaperoned exam.

## 2012-07-03 NOTE — Telephone Encounter (Signed)
Patient called stating she was to call us and let us know the generic alternative for Maxalt. It is rizatriptan benzoate. She would like Dr. Para March to let her know if it would be okay to switch to this med.

## 2012-07-05 ENCOUNTER — Encounter: Payer: Self-pay | Admitting: Family Medicine

## 2012-07-05 DIAGNOSIS — H9190 Unspecified hearing loss, unspecified ear: Secondary | ICD-10-CM | POA: Insufficient documentation

## 2012-07-05 DIAGNOSIS — Z0000 Encounter for general adult medical examination without abnormal findings: Secondary | ICD-10-CM | POA: Insufficient documentation

## 2012-07-05 NOTE — Telephone Encounter (Signed)
That would be fine, but she doesn't need a refill now.  We can send it when she needs it.  Thanks.

## 2012-07-05 NOTE — Assessment & Plan Note (Signed)
Refer for testing 

## 2012-07-05 NOTE — Assessment & Plan Note (Signed)
Controlled, continue current meds for now.  We can check on generics later.

## 2012-07-05 NOTE — Assessment & Plan Note (Signed)
Controlled, continue current meds.   

## 2012-07-05 NOTE — Assessment & Plan Note (Signed)
See scanned forms.  Routine anticipatory guidance given to patient.  See health maintenance. Tetanus 2012 Flu 2013 Shingles d/w pt PNA deferred by patient D/w patient [id number] for colon cancer screening, including IFOB vs. colonoscopy.  Risks and benefits of both were discussed and patient voiced understanding.  Pt elects WUJ:WJXB mammo 07/01/12

## 2012-07-06 NOTE — Telephone Encounter (Signed)
Patient advised.

## 2012-07-16 ENCOUNTER — Other Ambulatory Visit: Payer: Medicare Other

## 2012-07-16 DIAGNOSIS — Z1211 Encounter for screening for malignant neoplasm of colon: Secondary | ICD-10-CM

## 2012-07-20 ENCOUNTER — Encounter: Payer: Self-pay | Admitting: *Deleted

## 2012-07-21 ENCOUNTER — Ambulatory Visit: Payer: Self-pay | Admitting: Family Medicine

## 2012-07-22 ENCOUNTER — Encounter: Payer: Self-pay | Admitting: Family Medicine

## 2012-07-22 DIAGNOSIS — M810 Age-related osteoporosis without current pathological fracture: Secondary | ICD-10-CM | POA: Insufficient documentation

## 2012-07-27 ENCOUNTER — Telehealth: Payer: Self-pay

## 2012-07-27 NOTE — Telephone Encounter (Signed)
Pt left v/m that she was to have f/u visit to discuss osteoporosis and bone density; pt is not paying office visit and request call back with results and treatment if needed.Please advise.

## 2012-07-28 NOTE — Telephone Encounter (Signed)
Mail her a copy of the bone density report.  She has osteoporosis, needs discussion about treatment, needs labs done re: osteoporosis.  I can't do all that over the phone or by mail.  I will await her office visit.

## 2012-07-28 NOTE — Telephone Encounter (Signed)
Patient notified as instructed by telephone. Copy of bone density mailed to verified home address as instructed. Pt has f/u appt 08/13/12.

## 2012-08-13 ENCOUNTER — Ambulatory Visit: Payer: Medicare Other | Admitting: Family Medicine

## 2012-08-14 ENCOUNTER — Encounter: Payer: Self-pay | Admitting: Family Medicine

## 2012-08-14 ENCOUNTER — Ambulatory Visit (INDEPENDENT_AMBULATORY_CARE_PROVIDER_SITE_OTHER): Payer: Medicare Other | Admitting: Family Medicine

## 2012-08-14 VITALS — BP 144/84 | HR 68 | Temp 97.9°F | Wt 195.0 lb

## 2012-08-14 DIAGNOSIS — M810 Age-related osteoporosis without current pathological fracture: Secondary | ICD-10-CM

## 2012-08-14 NOTE — Patient Instructions (Addendum)
Go to the lab on the way out.  We'll contact you with your lab report. We'll let you know what to do about the vitamin D and the fosamax.   Take care.

## 2012-08-14 NOTE — Assessment & Plan Note (Addendum)
>  25 min spent with face to face with patient, >50% counseling and/or coordinating care D/w pt about DXA results.  Is weight bearing, doesn't smoke now, vit D low.  Needs repletion of vit D. At that point, would proceed with fosmax.  D/w pt about fracture risk, both reduction of hip/back and inc in atypical fx along with osteonecrosis of jaw.  No plan for dental intervention now or in the future.  Benefits likely outweigh risk of med.  Routine instructions, esp re: upright for d/s pt.  She understood.  Will replete vit d and then proceed with fosamax. Recheck vit D in 3 months.

## 2012-08-15 LAB — VITAMIN D 25 HYDROXY (VIT D DEFICIENCY, FRACTURES): Vit D, 25-Hydroxy: 18 ng/mL — ABNORMAL LOW (ref 30–89)

## 2012-08-15 MED ORDER — ERGOCALCIFEROL 1.25 MG (50000 UT) PO CAPS: 50000.0000 [IU] | ORAL_CAPSULE | ORAL | Status: DC

## 2012-08-15 MED ORDER — ALENDRONATE SODIUM 70 MG PO TABS: 70.0000 mg | ORAL_TABLET | ORAL | Status: DC

## 2012-08-15 NOTE — Progress Notes (Signed)
Returned for counseling re: DXA results.  See plan. Discussed in detail  Meds, vitals, and allergies reviewed.   ROS: See HPI.  Otherwise, noncontributory.  nad No active tooth decay on oral exam.

## 2012-08-17 ENCOUNTER — Telehealth: Payer: Self-pay | Admitting: *Deleted

## 2012-08-17 NOTE — Telephone Encounter (Signed)
Ms. Dangel called in saying that she realizes she doesn't qualify for the handicap sticker but that they needed 4 stickers and she said you had actually written for 4 stickers on the form that they turned in for her husband but the [facility] says you can only get 2 stickers per person.  Therefore, the DMV recommended that she get 2 stickers as well.  I explained that I wasn't sure you could do that because the answers to these questions would apply to her and therefore would not qualify her for the sticker.  I told her that I would explain this to you and see what you thought.  Form is back in your in box along with the Rx for her.  I thought she could pick that up when she gets the form.  Otherwise, I will mail the Rx to her.

## 2012-08-17 NOTE — Telephone Encounter (Signed)
Patient advised.

## 2012-08-17 NOTE — Telephone Encounter (Signed)
She doesn't qualify.  They can ask about placards to hang from the rear view mirror, so they can move them from car to car as needed.

## 2012-10-13 ENCOUNTER — Other Ambulatory Visit (INDEPENDENT_AMBULATORY_CARE_PROVIDER_SITE_OTHER): Payer: Medicare Other

## 2012-10-13 DIAGNOSIS — M810 Age-related osteoporosis without current pathological fracture: Secondary | ICD-10-CM

## 2012-10-14 LAB — VITAMIN D 25 HYDROXY (VIT D DEFICIENCY, FRACTURES): Vit D, 25-Hydroxy: 34 ng/mL (ref 30–89)

## 2012-10-15 ENCOUNTER — Other Ambulatory Visit: Payer: Self-pay | Admitting: *Deleted

## 2012-10-15 ENCOUNTER — Telehealth: Payer: Self-pay | Admitting: *Deleted

## 2012-10-15 MED ORDER — ALENDRONATE SODIUM 70 MG PO TABS: 70.0000 mg | ORAL_TABLET | ORAL | Status: DC

## 2012-10-15 NOTE — Telephone Encounter (Signed)
Patient was advised of lab results and Rx sent to pharmacy.

## 2013-01-07 ENCOUNTER — Other Ambulatory Visit: Payer: Self-pay | Admitting: Family Medicine

## 2013-01-07 DIAGNOSIS — E7800 Pure hypercholesterolemia, unspecified: Secondary | ICD-10-CM

## 2013-01-13 ENCOUNTER — Other Ambulatory Visit (INDEPENDENT_AMBULATORY_CARE_PROVIDER_SITE_OTHER): Payer: Medicare Other

## 2013-01-13 DIAGNOSIS — E7800 Pure hypercholesterolemia, unspecified: Secondary | ICD-10-CM

## 2013-01-13 LAB — COMPREHENSIVE METABOLIC PANEL
ALT: 15 U/L (ref 0–35)
AST: 19 U/L (ref 0–37)
Calcium: 9 mg/dL (ref 8.4–10.5)
Chloride: 107 mEq/L (ref 96–112)
Creatinine, Ser: 1 mg/dL (ref 0.4–1.2)

## 2013-01-13 LAB — LIPID PANEL
HDL: 41.9 mg/dL (ref >39.00)
Total CHOL/HDL Ratio: 5
VLDL: 24.6 mg/dL (ref 0.0–40.0)

## 2013-01-14 ENCOUNTER — Encounter: Payer: Self-pay | Admitting: *Deleted

## 2013-09-09 ENCOUNTER — Other Ambulatory Visit: Payer: Self-pay | Admitting: Family Medicine

## 2013-10-21 ENCOUNTER — Other Ambulatory Visit: Payer: Self-pay | Admitting: Family Medicine

## 2013-10-21 DIAGNOSIS — E7800 Pure hypercholesterolemia, unspecified: Secondary | ICD-10-CM

## 2013-10-21 DIAGNOSIS — M810 Age-related osteoporosis without current pathological fracture: Secondary | ICD-10-CM

## 2013-10-26 ENCOUNTER — Other Ambulatory Visit (INDEPENDENT_AMBULATORY_CARE_PROVIDER_SITE_OTHER): Payer: Medicare Other

## 2013-10-26 ENCOUNTER — Other Ambulatory Visit: Payer: Medicare Other

## 2013-10-26 DIAGNOSIS — E7800 Pure hypercholesterolemia, unspecified: Secondary | ICD-10-CM

## 2013-10-26 DIAGNOSIS — M810 Age-related osteoporosis without current pathological fracture: Secondary | ICD-10-CM

## 2013-10-26 LAB — COMPREHENSIVE METABOLIC PANEL
ALBUMIN: 4 g/dL (ref 3.5–5.2)
ALT: 12 U/L (ref 0–35)
AST: 17 U/L (ref 0–37)
Alkaline Phosphatase: 62 U/L (ref 39–117)
BUN: 13 mg/dL (ref 6–23)
CO2: 29 mEq/L (ref 19–32)
CREATININE: 1.1 mg/dL (ref 0.4–1.2)
Calcium: 9.3 mg/dL (ref 8.4–10.5)
Chloride: 107 mEq/L (ref 96–112)
GFR: 55.46 mL/min — ABNORMAL LOW (ref >60.00)
GLUCOSE: 87 mg/dL (ref 70–99)
POTASSIUM: 4.1 meq/L (ref 3.5–5.1)
Sodium: 142 mEq/L (ref 135–145)
Total Bilirubin: 1.2 mg/dL (ref 0.3–1.2)
Total Protein: 7.2 g/dL (ref 6.0–8.3)

## 2013-10-26 LAB — LIPID PANEL
CHOLESTEROL: 203 mg/dL — AB (ref 0–200)
HDL: 47.8 mg/dL (ref >39.00)
Total CHOL/HDL Ratio: 4
Triglycerides: 125 mg/dL (ref 0.0–149.0)
VLDL: 25 mg/dL (ref 0.0–40.0)

## 2013-10-26 LAB — LDL CHOLESTEROL, DIRECT: Direct LDL: 139 mg/dL

## 2013-10-27 LAB — VITAMIN D 25 HYDROXY (VIT D DEFICIENCY, FRACTURES): Vit D, 25-Hydroxy: 20 ng/mL — ABNORMAL LOW (ref 30–89)

## 2013-10-29 ENCOUNTER — Encounter: Payer: Self-pay | Admitting: Family Medicine

## 2013-10-29 ENCOUNTER — Ambulatory Visit (INDEPENDENT_AMBULATORY_CARE_PROVIDER_SITE_OTHER): Payer: Medicare Other | Admitting: Family Medicine

## 2013-10-29 ENCOUNTER — Ambulatory Visit (INDEPENDENT_AMBULATORY_CARE_PROVIDER_SITE_OTHER)
Admission: RE | Admit: 2013-10-29 | Discharge: 2013-10-29 | Disposition: A | Discharge status: Home / Self Care | Payer: Medicare Other | Source: Ambulatory Visit | Attending: Family Medicine | Admitting: Family Medicine

## 2013-10-29 VITALS — BP 128/70 | HR 99 | Temp 98.0°F | Ht 65.0 in | Wt 195.0 lb

## 2013-10-29 DIAGNOSIS — M810 Age-related osteoporosis without current pathological fracture: Secondary | ICD-10-CM

## 2013-10-29 DIAGNOSIS — M25569 Pain in unspecified knee: Secondary | ICD-10-CM

## 2013-10-29 DIAGNOSIS — Z23 Encounter for immunization: Secondary | ICD-10-CM

## 2013-10-29 DIAGNOSIS — Z0000 Encounter for general adult medical examination without abnormal findings: Secondary | ICD-10-CM

## 2013-10-29 DIAGNOSIS — E7800 Pure hypercholesterolemia, unspecified: Secondary | ICD-10-CM

## 2013-10-29 MED ORDER — PRAVASTATIN SODIUM 40 MG PO TABS: ORAL_TABLET | ORAL | Status: DC

## 2013-10-29 MED ORDER — ALENDRONATE SODIUM 70 MG PO TABS: 70.0000 mg | ORAL_TABLET | ORAL | Status: DC

## 2013-10-29 MED ORDER — CHOLECALCIFEROL 25 MCG (1000 UT) PO CAPS: 1000.0000 [IU] | ORAL_CAPSULE | Freq: Every day | ORAL | Status: DC

## 2013-10-29 NOTE — Progress Notes (Signed)
Pre-visit discussion using our clinic review tool. No additional management support is needed unless otherwise documented below in the visit note.  I have personally reviewed the Medicare Annual Wellness questionnaire and have noted 1. The patient's medical and social history 2. Their use of alcohol, tobacco or illicit drugs 3. Their current medications and supplements 4. The patient's functional ability including ADL's, fall risks, home safety risks and hearing or visual             impairment. 5. Diet and physical activities 6. Evidence for depression or mood disorders  The patients weight, height, BMI have been recorded in the chart and visual acuity is per eye clinic.  I have made referrals, counseling and provided education to the patient based review of the above and I have provided the pt with a written personalized care plan for preventive services.  See scanned forms.  Routine anticipatory guidance given to patient.  See health maintenance. Flu 2014 Shingles d/w pt.   PNA 2015 Tetanus 2012 D/w patient [id number]:options for colon cancer screening, including IFOB vs. colonoscopy.  Risks and benefits of both were discussed and patient voiced understanding.  Pt elects OZH:YQMVfor:IFOB.  Breast cancer screening- mammogram schedule and pending.  Advance directive- would have husband designated if incapacitated.   Cognitive function addressed- see scanned forms- and if abnormal then additional documentation follows.   She missed a step about 1.5 years ago.  Had a pop in the R knee with pain at the time. She got better, then only had intermittent pain. More recently with diffuse and consistent pain. It keeps he from sleeping.  No locking.  advil doesn't help.  It "tries" to give out "once."  She has been reinforced wearing a brace prn, with some help.    Osteoporosis. On tx since 2013.  Not due for f/u DXA yet, last done 2013.   Some nausea with alendronate but o/w no ADE.  Discussed routine taking med.   Vit D low.  Discussed.    Elevated Cholesterol: Using medications without problems:yes Muscle aches: no Diet compliance:encouraged Exercise:yes  PMH and SH reviewed  Meds, vitals, and allergies reviewed.   ROS: See HPI.  Otherwise negative.    GEN: nad, alert and oriented HEENT: mucous membranes moist NECK: supple w/o LA CV: rrr. PULM: ctab, no inc wob ABD: soft, +bs EXT: no edema SKIN: no acute rash R knee puffy.   Tender posteriorly and on the joint line with crepitus.

## 2013-10-29 NOTE — Patient Instructions (Addendum)
Check with your insurance to see if they will cover the shingles shot. Start taking 1000 units of vit D a day.  You can get it over the counter.   Take care.  Glad to see you.

## 2013-10-31 DIAGNOSIS — M25569 Pain in unspecified knee: Secondary | ICD-10-CM | POA: Insufficient documentation

## 2013-10-31 NOTE — Assessment & Plan Note (Signed)
See scanned forms.  Routine anticipatory guidance given to patient.  See health maintenance. Flu 2014 Shingles d/w pt.   PNA 2015 Tetanus 2012 D/w patient [id number]:options for colon cancer screening, including IFOB vs. colonoscopy.  Risks and benefits of both were discussed and patient voiced understanding.  Pt elects NWG:NFAOfor:IFOB.  Breast cancer screening- mammogram schedule and pending.  Advance directive- would have husband designated if incapacitated.   Cognitive function addressed- see scanned forms- and if abnormal then additional documentation follows.

## 2013-10-31 NOTE — Assessment & Plan Note (Signed)
Refer to ortho. See xray report.

## 2013-10-31 NOTE — Assessment & Plan Note (Signed)
Continue as is with med, work on diet and weight.  Discussed.

## 2013-10-31 NOTE — Assessment & Plan Note (Signed)
Continue fosamax for now, restart vit D.  Not due for repeat DXA yet.

## 2013-11-01 ENCOUNTER — Encounter: Payer: Self-pay | Admitting: Family Medicine

## 2013-11-01 ENCOUNTER — Ambulatory Visit: Payer: Self-pay | Admitting: Family Medicine

## 2013-11-05 ENCOUNTER — Encounter: Payer: Self-pay | Admitting: Family Medicine

## 2013-11-05 ENCOUNTER — Ambulatory Visit: Payer: Self-pay | Admitting: Family Medicine

## 2014-08-23 ENCOUNTER — Ambulatory Visit (INDEPENDENT_AMBULATORY_CARE_PROVIDER_SITE_OTHER): Payer: Medicare Other | Admitting: Family Medicine

## 2014-08-23 ENCOUNTER — Encounter: Payer: Self-pay | Admitting: Family Medicine

## 2014-08-23 VITALS — BP 126/80 | HR 77 | Temp 98.3°F | Ht 65.0 in | Wt 199.5 lb

## 2014-08-23 DIAGNOSIS — J0100 Acute maxillary sinusitis, unspecified: Secondary | ICD-10-CM

## 2014-08-23 DIAGNOSIS — J019 Acute sinusitis, unspecified: Secondary | ICD-10-CM | POA: Insufficient documentation

## 2014-08-23 MED ORDER — AMOXICILLIN 500 MG PO CAPS: 1000.0000 mg | ORAL_CAPSULE | Freq: Two times a day (BID) | ORAL | Status: DC

## 2014-08-23 NOTE — Assessment & Plan Note (Signed)
Given measured fever, facail apin and > 7 days.. Treat with antibiotics.  Nasal saline irrigation. Mucinex DM twice daily. Restart flonase 2 spray per nostril. Complete amoxicillin.

## 2014-08-23 NOTE — Progress Notes (Signed)
Pre visit review using our clinic review tool, if applicable. No additional management support is needed unless otherwise documented below in the visit note. 

## 2014-08-23 NOTE — Progress Notes (Signed)
   Subjective:    Patient ID: Rebecca Moran, female    DOB: Apr 15, 1946, 68 y.o.   MRN: [id number]  Sinusitis This is a new problem. The current episode started in the past 7 days. The maximum temperature recorded prior to her arrival was 101 - 101.9 F (subjective). The pain is moderate. Associated symptoms include congestion, coughing, headaches and sinus pressure. Pertinent negatives include no ear pain, neck pain, shortness of breath, sneezing, sore throat or swollen glands. (Post nasal drip) Treatments tried: netty pot, zyrtec, has not been using flonase lately. The treatment provided mild relief.        Review of Systems  HENT: Positive for congestion and sinus pressure. Negative for ear pain, sneezing and sore throat.   Respiratory: Positive for cough. Negative for shortness of breath.   Musculoskeletal: Negative for neck pain.  Neurological: Positive for headaches.       Objective:   Physical Exam  Constitutional: Vital signs are normal. She appears well-developed and well-nourished. She is cooperative.  Non-toxic appearance. She does not appear ill. No distress.  HENT:  Head: Normocephalic.  Right Ear: Hearing, tympanic membrane, external ear and ear canal normal. Tympanic membrane is not erythematous, not retracted and not bulging.  Left Ear: Hearing, tympanic membrane, external ear and ear canal normal. Tympanic membrane is not erythematous, not retracted and not bulging.  Nose: Mucosal edema and rhinorrhea present. Right sinus exhibits maxillary sinus tenderness and frontal sinus tenderness. Left sinus exhibits maxillary sinus tenderness and frontal sinus tenderness.  Mouth/Throat: Uvula is midline, oropharynx is clear and moist and mucous membranes are normal.  Eyes: Conjunctivae, EOM and lids are normal. Pupils are equal, round, and reactive to light. Lids are everted and swept, no foreign bodies found.  Neck: Trachea normal and normal range of motion. Neck supple.  Carotid bruit is not present. No thyroid mass and no thyromegaly present.  Cardiovascular: Normal rate, regular rhythm, S1 normal, S2 normal, normal heart sounds, intact distal pulses and normal pulses.  Exam reveals no gallop and no friction rub.   No murmur heard. Pulmonary/Chest: Effort normal and breath sounds normal. No tachypnea. No respiratory distress. She has no decreased breath sounds. She has no wheezes. She has no rhonchi. She has no rales.  Neurological: She is alert.  Skin: Skin is warm, dry and intact. No rash noted.  Psychiatric: Her speech is normal and behavior is normal. Judgment normal. Her mood appears not anxious. Cognition and memory are normal. She does not exhibit a depressed mood.          Assessment & Plan:

## 2014-08-23 NOTE — Patient Instructions (Signed)
Nasal saline irrigation. Mucinex DM twice daily. Restart flonase 2 spray per nostril. Complete amoxicillin.

## 2014-09-30 ENCOUNTER — Telehealth: Payer: Self-pay

## 2014-09-30 NOTE — Telephone Encounter (Signed)
I don't see where she has had it done recently.

## 2014-09-30 NOTE — Telephone Encounter (Signed)
I am not sure if she didn't pick it up and then the order was automatically cancelled after a period of time.  It may be possible that the order didn't go in, but that would be atypical.  If we need to contact the patient or do anything at this point, please let us know.  Thank you for your help.

## 2014-10-03 NOTE — Telephone Encounter (Signed)
Nothing at the moment.   Pt does have an AWV on 11/08/14.  Thanks for getting back to me! Have a great day!

## 2014-10-23 ENCOUNTER — Other Ambulatory Visit: Payer: Self-pay | Admitting: Family Medicine

## 2014-10-23 DIAGNOSIS — E7800 Pure hypercholesterolemia, unspecified: Secondary | ICD-10-CM

## 2014-10-23 DIAGNOSIS — M810 Age-related osteoporosis without current pathological fracture: Secondary | ICD-10-CM

## 2014-10-25 ENCOUNTER — Other Ambulatory Visit: Payer: Medicare Other

## 2014-10-29 ENCOUNTER — Other Ambulatory Visit: Payer: Self-pay | Admitting: Family Medicine

## 2014-11-01 ENCOUNTER — Encounter: Payer: Medicare Other | Admitting: Family Medicine

## 2014-11-08 ENCOUNTER — Encounter: Payer: Medicare Other | Admitting: Family Medicine

## 2014-11-15 IMAGING — MG MM DIGITAL SCREENING BILAT W/ CAD
1 series · 5 of 5 positions shown · non-contrast
Comparison: Previous Exam(s)

CLINICAL DATA: Screening.

EXAM:
DIGITAL SCREENING BILATERAL MAMMOGRAM WITH CAD

[R CC · right · 5 of 5 slices shown]
[im 1/5]
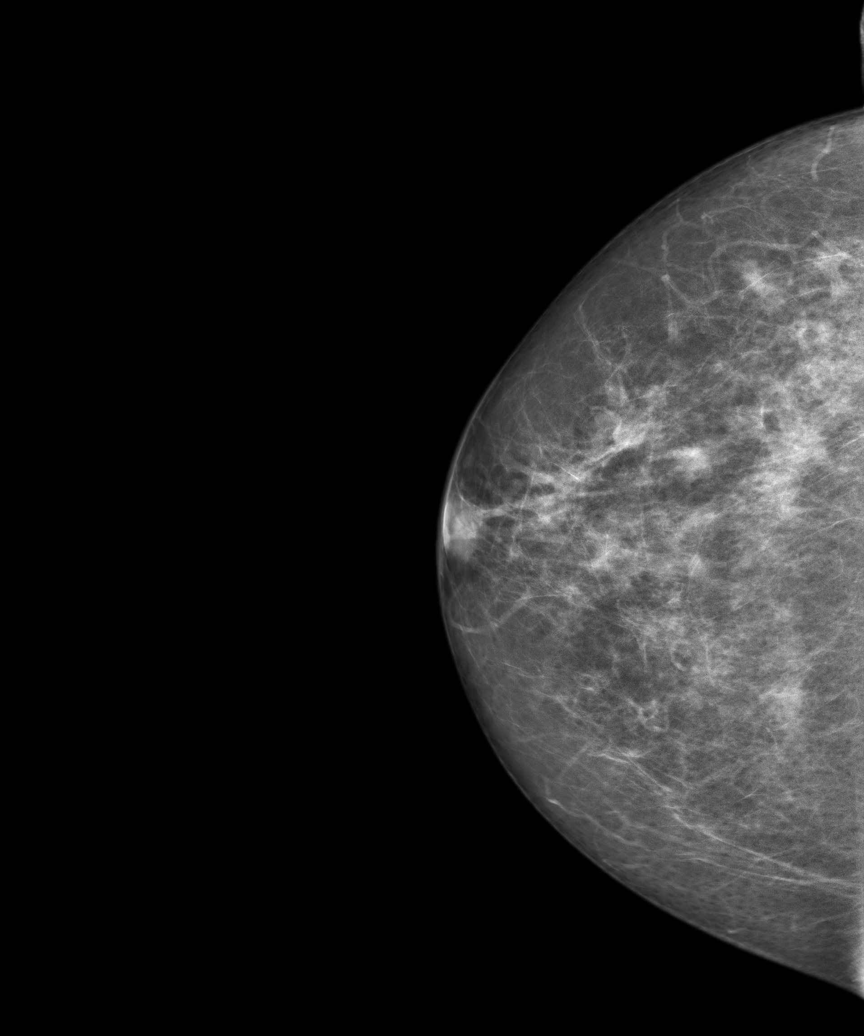
[im 2/5]
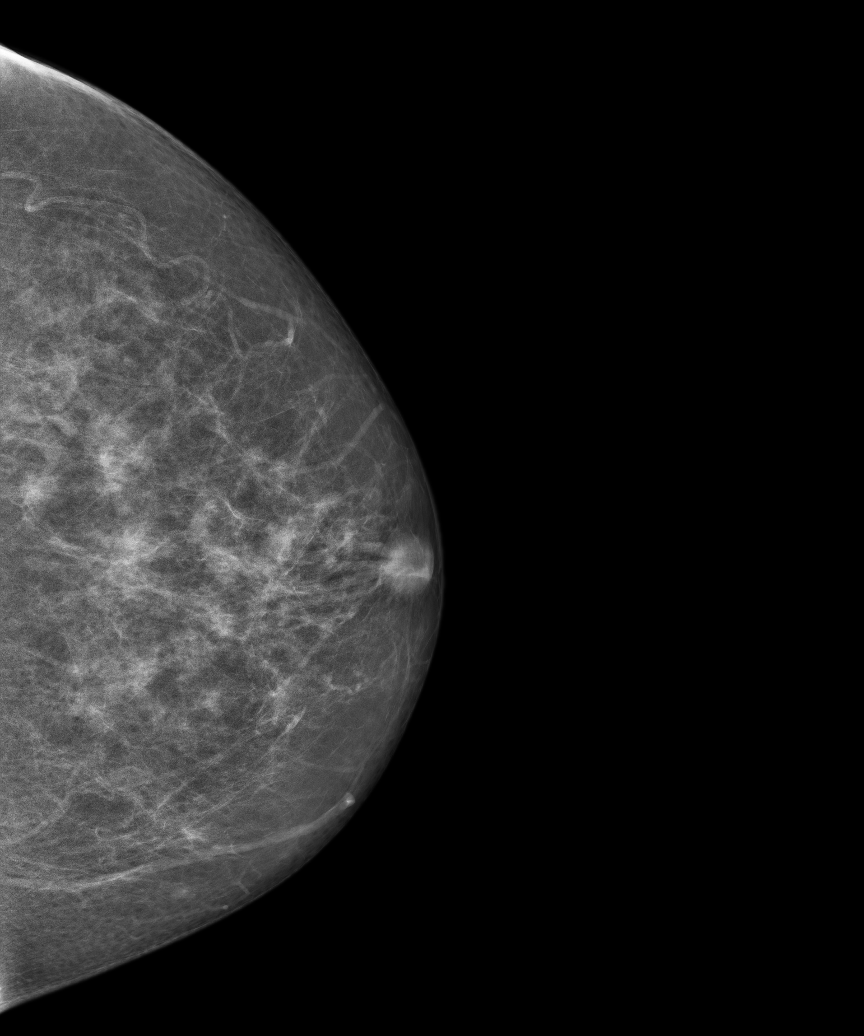
[im 3/5]
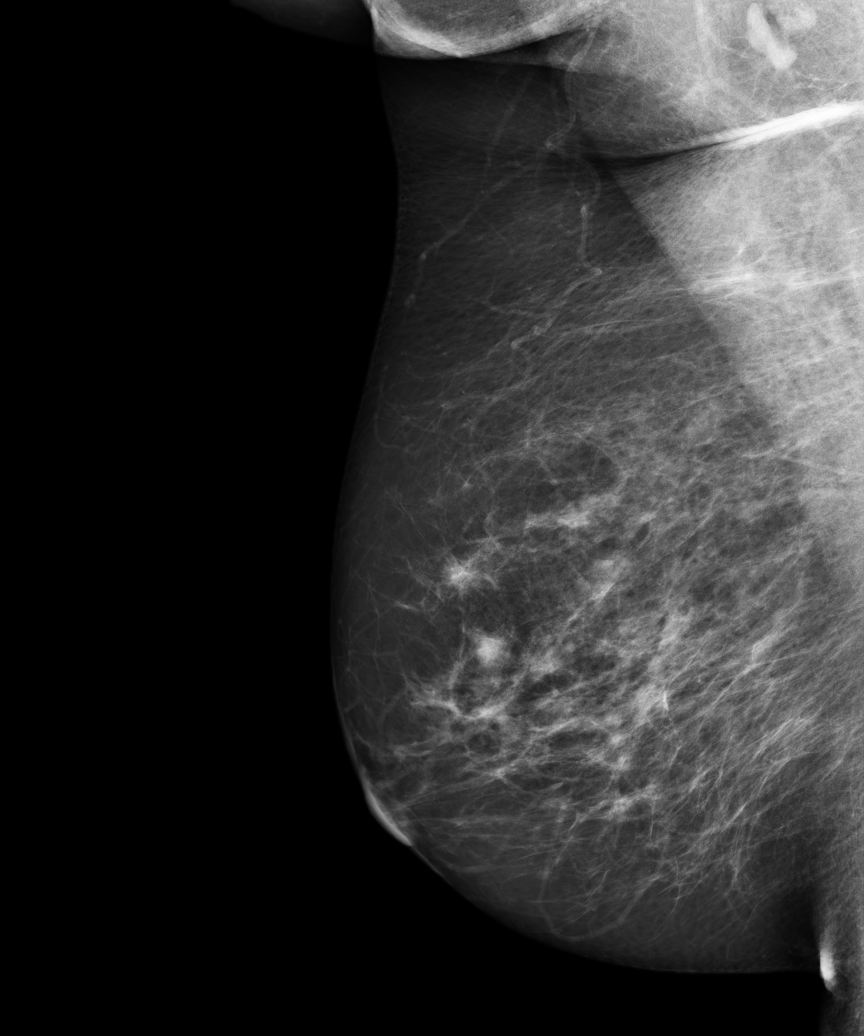
[im 4/5]
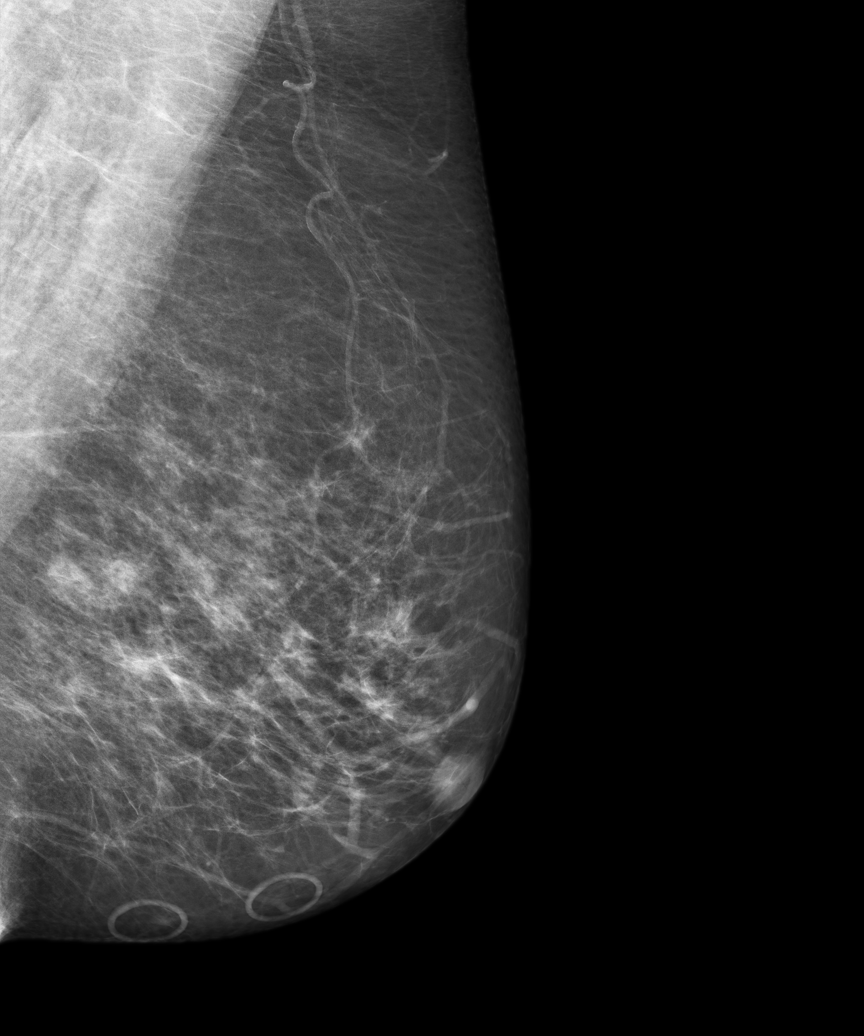
[im 5/5]
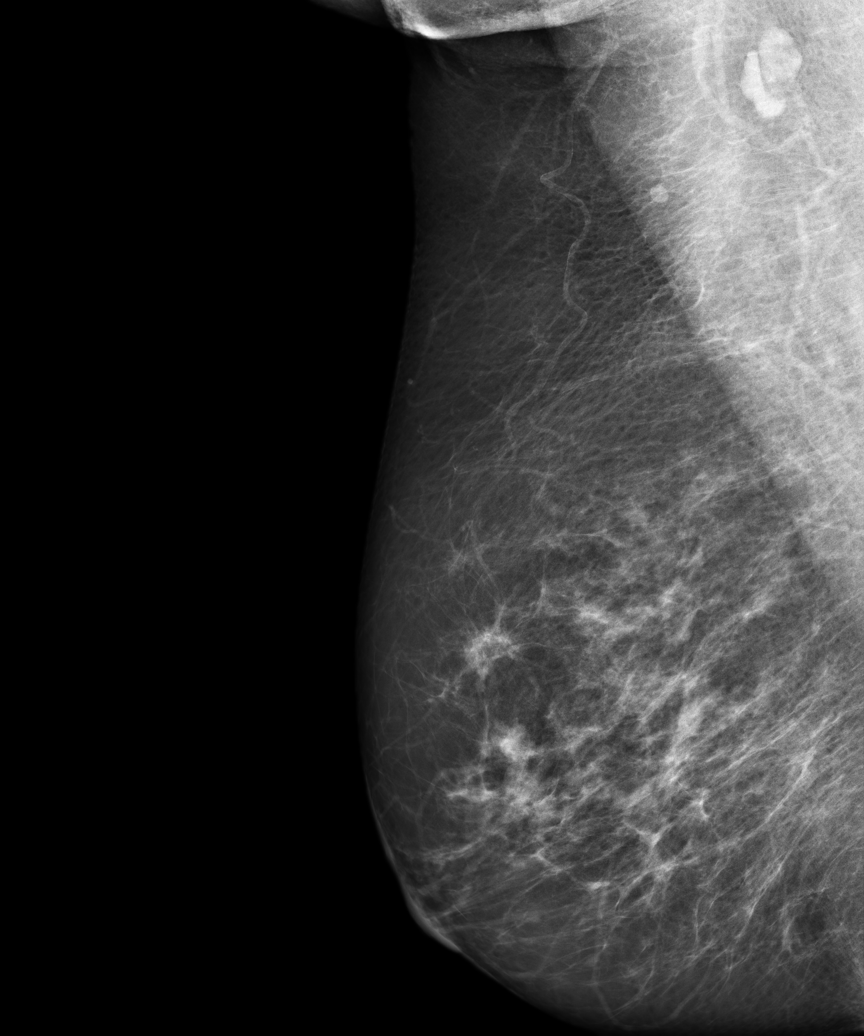

[5 of 5 positions shown; findings below may reference images not displayed]

ACR Breast Density Category c: The breast tissue is heterogeneously
dense, which may obscure small masses.
FINDINGS: In the right breast, possible distortion warrants further evaluation
with spot compression views and possibly ultrasound. In the left
breast, no findings suspicious for malignancy. Images were processed
with CAD.
IMPRESSION: Further evaluation is suggested for possible distortion in the right
breast.

RECOMMENDATION:
Diagnostic mammogram and possibly ultrasound of the right breast.
(Code:[id number])

The patient will be contacted regarding the findings, and additional
imaging will be scheduled.

BI-RADS CATEGORY  0: Incomplete. Need additional imaging evaluation
and/or prior mammograms for comparison.

## 2014-12-05 ENCOUNTER — Other Ambulatory Visit (INDEPENDENT_AMBULATORY_CARE_PROVIDER_SITE_OTHER): Payer: Medicare Other

## 2014-12-05 DIAGNOSIS — E7800 Pure hypercholesterolemia, unspecified: Secondary | ICD-10-CM

## 2014-12-05 DIAGNOSIS — M810 Age-related osteoporosis without current pathological fracture: Secondary | ICD-10-CM

## 2014-12-05 LAB — COMPREHENSIVE METABOLIC PANEL
ALK PHOS: 62 U/L (ref 39–117)
ALT: 17 U/L (ref 0–35)
AST: 22 U/L (ref 0–37)
Albumin: 4.1 g/dL (ref 3.5–5.2)
BUN: 10 mg/dL (ref 6–23)
CHLORIDE: 106 meq/L (ref 96–112)
CO2: 32 mEq/L (ref 19–32)
CREATININE: 0.94 mg/dL (ref 0.40–1.20)
Calcium: 9.7 mg/dL (ref 8.4–10.5)
GFR: 62.8 mL/min (ref >60.00)
GLUCOSE: 91 mg/dL (ref 70–99)
Potassium: 4 mEq/L (ref 3.5–5.1)
Sodium: 141 mEq/L (ref 135–145)
Total Bilirubin: 0.8 mg/dL (ref 0.2–1.2)
Total Protein: 6.8 g/dL (ref 6.0–8.3)

## 2014-12-05 LAB — VITAMIN D 25 HYDROXY (VIT D DEFICIENCY, FRACTURES): VITD: 25.5 ng/mL — ABNORMAL LOW (ref 30.00–100.00)

## 2014-12-05 LAB — LIPID PANEL
CHOL/HDL RATIO: 4
Cholesterol: 193 mg/dL (ref 0–200)
HDL: 47.7 mg/dL (ref >39.00)
LDL Cholesterol: 120 mg/dL — ABNORMAL HIGH (ref 0–99)
NonHDL: 145.3
TRIGLYCERIDES: 129 mg/dL (ref 0.0–149.0)
VLDL: 25.8 mg/dL (ref 0.0–40.0)

## 2014-12-07 ENCOUNTER — Other Ambulatory Visit: Payer: Self-pay | Admitting: Family Medicine

## 2014-12-07 NOTE — Telephone Encounter (Signed)
Received refill request electronically from pharmacy. See allergy/contraindication. Is it okay to refill medication? 

## 2014-12-08 ENCOUNTER — Encounter: Payer: Self-pay | Admitting: Family Medicine

## 2014-12-08 NOTE — Telephone Encounter (Signed)
Sent. Thanks.   

## 2014-12-09 ENCOUNTER — Ambulatory Visit (INDEPENDENT_AMBULATORY_CARE_PROVIDER_SITE_OTHER): Payer: Medicare Other | Admitting: Family Medicine

## 2014-12-09 ENCOUNTER — Encounter: Payer: Self-pay | Admitting: Family Medicine

## 2014-12-09 VITALS — BP 142/84 | HR 70 | Temp 97.6°F | Ht 65.5 in | Wt 196.0 lb

## 2014-12-09 DIAGNOSIS — Z0000 Encounter for general adult medical examination without abnormal findings: Secondary | ICD-10-CM

## 2014-12-09 DIAGNOSIS — Z7189 Other specified counseling: Secondary | ICD-10-CM

## 2014-12-09 DIAGNOSIS — Z1211 Encounter for screening for malignant neoplasm of colon: Secondary | ICD-10-CM

## 2014-12-09 DIAGNOSIS — M810 Age-related osteoporosis without current pathological fracture: Secondary | ICD-10-CM

## 2014-12-09 DIAGNOSIS — E7800 Pure hypercholesterolemia, unspecified: Secondary | ICD-10-CM

## 2014-12-09 MED ORDER — CHOLECALCIFEROL 25 MCG (1000 UT) PO CAPS: 2000.0000 [IU] | ORAL_CAPSULE | Freq: Every day | ORAL | Status: DC

## 2014-12-09 NOTE — Patient Instructions (Addendum)
Go to the lab on the way out.  We'll contact you with your lab report. Rebecca Moran will call about your referral about the DXA.  Take care.  Glad to see you.  Increase to 2000 units of vit D a day.

## 2014-12-09 NOTE — Progress Notes (Signed)
Pre visit review using our clinic review tool, if applicable. No additional management support is needed unless otherwise documented below in the visit note.  I have personally reviewed the Medicare Annual Wellness questionnaire and have noted 1. The patient's medical and social history 2. Their use of alcohol, tobacco or illicit drugs 3. Their current medications and supplements 4. The patient's functional ability including ADL's, fall risks, home safety risks and hearing or visual             impairment. 5. Diet and physical activities 6. Evidence for depression or mood disorders  The patients weight, height, BMI have been recorded in the chart and visual acuity is per eye clinic.  I have made referrals, counseling and provided education to the patient based review of the above and I have provided the pt with a written personalized care plan for preventive services.  Provider list updated- see scanned forms.  Routine anticipatory guidance given to patient.  See health maintenance.  Flu 2015 Shingles d/w pt.she checked on coverage.    PNA 2015 Tetanus 2012 D/w patient UJ:[id number]:options for colon cancer screening, including IFOB vs. colonoscopy. Risks and benefits of both were discussed and patient voiced understanding. Pt elects WGN:FAOZfor:IFOB.  Breast cancer screening- mammogram pending.  D/w pt.  She'll call about that.   Advance directive- would have husband designated if incapacitated.  Cognitive function addressed- see scanned forms- and if abnormal then additional documentation follows.   Rare migraines after retiring.    Osteoporosis. On tx since 2013. DXA last done 2013. Some nausea prev with alendronate but not now.  O/w no ADE. Discussed routine taking med. Discussed about routine f/u DXA.   Vit D d/w pt, ie inc to 2000 units a day.    Elevated Cholesterol: Using medications without problems: yes Muscle aches: no Diet compliance:yes, dw pt.  Weight stable.   Exercise:yes  PMH and SH reviewed  Meds, vitals, and allergies reviewed.   ROS: See HPI.  Otherwise negative.    GEN: nad, alert and oriented HEENT: mucous membranes moist NECK: supple w/o LA CV: rrr. PULM: ctab, no inc wob ABD: soft, +bs EXT: no edema SKIN: no acute rash

## 2014-12-12 DIAGNOSIS — Z7189 Other specified counseling: Secondary | ICD-10-CM | POA: Insufficient documentation

## 2014-12-12 NOTE — Assessment & Plan Note (Signed)
Flu 2015 Shingles d/w pt.she checked on coverage.    PNA 2015 Tetanus 2012 D/w patient [id number]:options for colon cancer screening, including IFOB vs. colonoscopy. Risks and benefits of both were discussed and patient voiced understanding. Pt elects WUJ:WJXBfor:IFOB.  Breast cancer screening- mammogram pending.  D/w pt.  She'll call about that.   Advance directive- would have husband designated if she were incapacitated.  Cognitive function addressed- see scanned forms- and if abnormal then additional documentation follows.

## 2014-12-12 NOTE — Assessment & Plan Note (Signed)
Reasonable control, continue statin.  D/w pt.  She agrees.

## 2014-12-12 NOTE — Assessment & Plan Note (Signed)
On tx since 2013. DXA last done 2013. Some nausea prev with alendronate but not now. O/w no ADE. Discussed routine taking med. Discussed about routine f/u DXA. Vit D d/w pt, ie inc to 2000 units a day.

## 2014-12-19 ENCOUNTER — Other Ambulatory Visit (INDEPENDENT_AMBULATORY_CARE_PROVIDER_SITE_OTHER): Payer: Medicare Other

## 2014-12-19 DIAGNOSIS — Z1211 Encounter for screening for malignant neoplasm of colon: Secondary | ICD-10-CM | POA: Diagnosis not present

## 2014-12-19 LAB — FECAL OCCULT BLOOD, IMMUNOCHEMICAL: Fecal Occult Bld: NEGATIVE

## 2014-12-21 ENCOUNTER — Ambulatory Visit
Admit: 2014-12-21 | Disposition: A | Discharge status: Home / Self Care | Payer: Self-pay | Attending: Family Medicine | Admitting: Family Medicine

## 2014-12-21 ENCOUNTER — Encounter: Payer: Self-pay | Admitting: Family Medicine

## 2014-12-21 DIAGNOSIS — M810 Age-related osteoporosis without current pathological fracture: Secondary | ICD-10-CM | POA: Diagnosis not present

## 2014-12-21 DIAGNOSIS — Z780 Asymptomatic menopausal state: Secondary | ICD-10-CM | POA: Diagnosis not present

## 2014-12-21 DIAGNOSIS — Z1231 Encounter for screening mammogram for malignant neoplasm of breast: Secondary | ICD-10-CM | POA: Diagnosis not present

## 2014-12-22 ENCOUNTER — Encounter: Payer: Self-pay | Admitting: *Deleted

## 2015-04-24 ENCOUNTER — Ambulatory Visit (INDEPENDENT_AMBULATORY_CARE_PROVIDER_SITE_OTHER): Payer: Medicare Other | Admitting: Internal Medicine

## 2015-04-24 ENCOUNTER — Encounter: Payer: Self-pay | Admitting: Internal Medicine

## 2015-04-24 VITALS — BP 116/78 | HR 72 | Temp 97.8°F | Wt 194.0 lb

## 2015-04-24 DIAGNOSIS — R21 Rash and other nonspecific skin eruption: Secondary | ICD-10-CM | POA: Diagnosis not present

## 2015-04-24 MED ORDER — CLOBETASOL PROPIONATE 0.05 % EX CREA: 1.0000 "application " | TOPICAL_CREAM | Freq: Two times a day (BID) | CUTANEOUS | Status: DC

## 2015-04-24 NOTE — Progress Notes (Signed)
Subjective:    Patient ID: Rebecca Moran, female    DOB: 08-12-1946, 69 y.o.   MRN: [id number]  HPI  Pt presents to the clinic today with c/o a rash on her legs. She first noticed this yesterday. It started on her thighs and has spread to her abdomen and back. It is very itchy. She denies changes in soaps, lotions and detergents. Her husband does not have a similar rash. She has been working outside in her neighbors yard, feeding their animals while they are away. She did pull a tick off her left arm 2 weeks ago but is not sure if it is related. She is not sure how long the tick was on her. It was not engorged when she pulled it off. She has take Zyrtec OTC without much relief.  Review of Systems  Past Medical History  Diagnosis Date  . History of migraines     MRI -head,secondary H?A L_OCCIP within normal limits 07/1999//CT of head without Normal ([facility]) 12/21/2007  . Hyperlipidemia   . Family history of cancer   . NSVD (normal spontaneous vaginal delivery)     x 2  . Allergy     seasonal  . Hearing loss     in L ear  . Osteoporosis     Current Outpatient Prescriptions  Medication Sig Dispense Refill  . alendronate (FOSAMAX) 70 MG tablet take 1 tablet by mouth every week on an empty stomach with 6-8 oz of water. No food / meds for 30 min. Remain Upright 13 tablet 3  . cetirizine (ZYRTEC) 10 MG tablet Take 10 mg by mouth daily.      . Cholecalciferol ([employer]S VITAMIN D3) 1000 UNITS capsule Take 2 capsules (2,000 Units total) by mouth daily.    . fluticasone (FLONASE) 50 MCG/ACT nasal spray Place 2 sprays into both nostrils daily.    . pravastatin (PRAVACHOL) 40 MG tablet take 1 tablet by mouth once daily 90 tablet 3  . rizatriptan (MAXALT-MLT) 10 MG disintegrating tablet Take 10 mg by mouth as needed. May repeat in 2 hours if needed      No current facility-administered medications for this visit.    Allergies  Allergen Reactions  . Crestor [Rosuvastatin Calcium]    indigestion  . Oxycodone-Acetaminophen     REACTION: Rash on hands  . Prednisone Other (See Comments)    Chest pain and elevated BP  . Sumatriptan     REACTION: Tightness in chest    Family History  Problem Relation Age of Onset  . Heart disease Mother 1    CHF  . Cancer Mother 41    lung cancer since her 58's with kidney cancer //cancer of the throat and tongue //never smoked or drank  . Diabetes Mother     borderline  . Cancer Father 84    melanoma  . Cancer Sister     breast cancer and ovarian  . Breast cancer Sister   . Stroke Maternal Grandmother   . Depression Maternal Grandfather   . Heart disease Paternal Grandfather     MI  . Diabetes Brother   . Colon cancer Neg Hx   . Cancer Brother     lung cancer    History   Social History  . Marital Status: Married    Spouse Name: N/A  . Number of Children: 2  . Years of Education: N/A   Occupational History  . 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 Smoking status: Former Smoker -- 2.00 packs/day for 22 years    Quit date: 09/23/1981  . Smokeless tobacco: Never Used     Comment: quit in 1975  . Alcohol Use: No  . Drug Use: No  . Sexual Activity: No     Comment: Husband with ED   Other Topics Concern  . Not on file   Social History Narrative   Married- 1967   Two stepchildren and two children of her own.   retired     Constitutional: Denies fever, malaise, fatigue, headache or abrupt weight changes.  Respiratory: Denies difficulty breathing, shortness of breath, cough or sputum production.   Cardiovascular: Denies chest pain, chest tightness, palpitations or swelling in the hands or feet.   Skin: Pt reports rash on her lower legs. Denies redness or ulcercations.    No other specific complaints in a complete review of systems (except as listed in HPI above).     Objective:   Physical Exam   BP 116/78 mmHg  Pulse 72  Temp(Src) 97.8 F (36.6 C)  (Oral)  Wt 194 lb (87.998 kg)  SpO2 98% Wt Readings from Last 3 Encounters:  04/24/15 194 lb (87.998 kg)  12/09/14 196 lb (88.905 kg)  08/23/14 199 lb 8 oz (90.493 kg)    General: Appears her stated age, well developed, well nourished in NAD. Skin: Warm, dry and intact. Scattered maculopapular lesions noted on back, abdomen and legs but most concentrated on her upper thighs. Cardiovascular: Normal rate and rhythm. S1,S2 noted.   Pulmonary/Chest: Normal effort and positive vesicular breath sounds. No respiratory distress. No wheezes, rales or ronchi noted.   BMET    Component Value Date/Time   NA 141 12/05/2014 0924   K 4.0 12/05/2014 0924   CL 106 12/05/2014 0924   CO2 32 12/05/2014 0924   GLUCOSE 91 12/05/2014 0924   BUN 10 12/05/2014 0924   CREATININE 0.94 12/05/2014 0924   CALCIUM 9.7 12/05/2014 0924   GFRNONAA 59.46 06/29/2009 0859   GFRAA 72 06/23/2008 0932    Lipid Panel     Component Value Date/Time   CHOL 193 12/05/2014 0924   TRIG 129.0 12/05/2014 0924   HDL 47.70 12/05/2014 0924   CHOLHDL 4 12/05/2014 0924   VLDL 25.8 12/05/2014 0924   LDLCALC 120* 12/05/2014 0924    CBC    Component Value Date/Time   WBC 5.0 03/22/2011 0837   RBC 4.81 03/22/2011 0837   HGB 13.9 03/22/2011 0837   HCT 40.5 03/22/2011 0837   PLT 199.0 03/22/2011 0837   MCV 84.2 03/22/2011 0837   MCHC 34.2 03/22/2011 0837   RDW 13.4 03/22/2011 0837   LYMPHSABS 1.9 03/22/2011 0837   MONOABS 0.4 03/22/2011 0837   EOSABS 0.1 03/22/2011 0837   BASOSABS 0.0 03/22/2011 0837    Hgb A1C No results found for: HGBA1C      Assessment & Plan:   Rash:  She is alergic to prednisone eRx for Clobetasol cream to affected area BID Continue Zyrtec in AM Benadryl at night if needed  RTC as needed or if symptoms persist or worsen

## 2015-04-24 NOTE — Progress Notes (Signed)
Pre visit review using our clinic review tool, if applicable. No additional management support is needed unless otherwise documented below in the visit note. 

## 2015-04-24 NOTE — Patient Instructions (Signed)

## 2015-06-27 ENCOUNTER — Other Ambulatory Visit: Payer: Self-pay | Admitting: Internal Medicine

## 2015-06-27 NOTE — Telephone Encounter (Signed)
Last filled 04/24/15 by you at [facility]--please advise if okay to refill

## 2015-08-08 ENCOUNTER — Encounter: Payer: Self-pay | Admitting: Cardiovascular Disease

## 2016-01-15 ENCOUNTER — Telehealth: Payer: Self-pay | Admitting: Family Medicine

## 2016-01-15 MED ORDER — ALENDRONATE SODIUM 70 MG PO TABS: ORAL_TABLET | ORAL | Status: DC

## 2016-01-15 NOTE — Telephone Encounter (Signed)
Worse GERD sx on alendronate recently.  Some vomiting.   She had been on it for years w/o trouble.   She had been taking it correctly.   She skipped last dose and felt better.   D/w pt.  Advised her to skip it for one month and the update me.  She agrees.

## 2016-02-13 ENCOUNTER — Other Ambulatory Visit: Payer: Self-pay | Admitting: *Deleted

## 2016-02-13 MED ORDER — PRAVASTATIN SODIUM 40 MG PO TABS: 40.0000 mg | ORAL_TABLET | Freq: Every day | ORAL | Status: DC

## 2016-05-20 ENCOUNTER — Other Ambulatory Visit: Payer: Self-pay | Admitting: Family Medicine

## 2016-05-26 ENCOUNTER — Encounter: Payer: Self-pay | Admitting: Family Medicine

## 2016-06-13 ENCOUNTER — Ambulatory Visit (INDEPENDENT_AMBULATORY_CARE_PROVIDER_SITE_OTHER): Payer: Medicare Other

## 2016-06-13 VITALS — BP 110/64 | HR 74 | Temp 97.8°F | Ht 64.5 in | Wt 188.2 lb

## 2016-06-13 DIAGNOSIS — Z0000 Encounter for general adult medical examination without abnormal findings: Secondary | ICD-10-CM

## 2016-06-13 NOTE — Patient Instructions (Signed)
Rebecca Moran , Thank you for taking time to come for your Medicare Wellness Visit. I appreciate your ongoing commitment to your health goals. Please review the following plan we discussed and let me know if I can assist you in the future.   These are the goals we discussed: Goals    . Increase physical activity          Starting 06/13/2016, I will continue to walk daily for 30 min.       This is a list of the screening recommended for you and due dates:  Health Maintenance  Topic Date Due  .  Hepatitis C: One time screening is recommended by Center for Disease Control  (CDC) for  adults born from [birth year] through 1965.   06/22/2016*  . Colon Cancer Screening  06/13/2017*  . Shingles Vaccine  06/13/2026*  . Mammogram  12/20/2016  . Tetanus Vaccine  03/28/2021  . Flu Shot  Addressed  . DEXA scan (bone density measurement)  Completed  . Pneumonia vaccines  Addressed  *Topic was postponed. The date shown is not the original due date.   Preventive Care for Adults  A healthy lifestyle and preventive care can promote health and wellness. Preventive health guidelines for adults include the following key practices.  . A routine yearly physical is a good way to check with your health care provider about your health and preventive screening. It is a chance to share any concerns and updates on your health and to receive a thorough exam.  . Visit your dentist for a routine exam and preventive care every 6 months. Brush your teeth twice a day and floss once a day. Good oral hygiene prevents tooth decay and gum disease.  . The frequency of eye exams is based on your age, health, family medical history, use  of contact lenses, and other factors. Follow your health care provider's ecommendations for frequency of eye exams.  . Eat a healthy diet. Foods like vegetables, fruits, whole grains, low-fat dairy products, and lean protein foods contain the nutrients you need without too many calories.  Decrease your intake of foods high in solid fats, added sugars, and salt. Eat the right amount of calories for you. Get information about a proper diet from your health care provider, if necessary.  . Regular physical exercise is one of the most important things you can do for your health. Most adults should get at least 150 minutes of moderate-intensity exercise (any activity that increases your heart rate and causes you to sweat) each week. In addition, most adults need muscle-strengthening exercises on 2 or more days a week.  Silver Sneakers may be a benefit available to you. To determine eligibility, you may visit the website: www.silversneakers.com or contact program at [phone] Mon-Fri between 8AM-8PM.   . Maintain a healthy weight. The body mass index (BMI) is a screening tool to identify possible weight problems. It provides an estimate of body fat based on height and weight. Your health care provider can find your BMI and can help you achieve or maintain a healthy weight.   For adults 20 years and older: ? A BMI below 18.5 is considered underweight. ? A BMI of 18.5 to 24.9 is normal. ? A BMI of 25 to 29.9 is considered overweight. ? A BMI of 30 and above is considered obese.   . Maintain normal blood lipids and cholesterol levels by exercising and minimizing your intake of saturated fat. Eat a balanced diet with plenty of  fruit and vegetables. Blood tests for lipids and cholesterol should begin at age 93 and be repeated every 5 years. If your lipid or cholesterol levels are high, you are over 50, or you are at high risk for heart disease, you may need your cholesterol levels checked more frequently. Ongoing high lipid and cholesterol levels should be treated with medicines if diet and exercise are not working.  . If you smoke, find out from your health care provider how to quit. If you do not use tobacco, please do not start.  . If you choose to drink alcohol, please do not consume  more than 2 drinks per day. One drink is considered to be 12 ounces (355 mL) of beer, 5 ounces (148 mL) of wine, or 1.5 ounces (44 mL) of liquor.  . If you are 74-37 years old, ask your health care provider if you should take aspirin to prevent strokes.  . Use sunscreen. Apply sunscreen liberally and repeatedly throughout the day. You should seek shade when your shadow is shorter than you. Protect yourself by wearing long sleeves, pants, a wide-brimmed hat, and sunglasses year round, whenever you are outdoors.  . Once a month, do a whole body skin exam, using a mirror to look at the skin on your back. Tell your health care provider of new moles, moles that have irregular borders, moles that are larger than a pencil eraser, or moles that have changed in shape or color.

## 2016-06-13 NOTE — Progress Notes (Signed)
Pre visit review using our clinic review tool, if applicable. No additional management support is needed unless otherwise documented below in the visit note. 

## 2016-06-13 NOTE — Progress Notes (Signed)
Subjective:   Rebecca Moran is a 70 y.o. female who presents for Medicare Annual (Subsequent) preventive examination.  Review of Systems:  N/A Cardiac Risk Factors include: advanced age (>6655men, 56>65 women);dyslipidemia;obesity (BMI >30kg/m2)     Objective:     Vitals: BP 110/64 (BP Location: Right Arm, Patient Position: Sitting, Cuff Size: Normal)   Pulse 74   Temp 97.8 F (36.6 C) (Oral)   Ht 5' 4.5" (1.638 m) Comment: no shoes  Wt 188 lb 4 oz (85.4 kg)   SpO2 98%   BMI 31.81 kg/m   Body mass index is 31.81 kg/m.   Tobacco History  Smoking Status  . Former Smoker  . Packs/day: 2.00  . Years: 22.00  . Quit date: 09/23/1981  Smokeless Tobacco  . Never Used    Comment: quit in 1975     Counseling given: No   Past Medical History:  Diagnosis Date  . Allergy    seasonal  . Family history of cancer   . Hearing loss    in L ear  . History of migraines    MRI -head,secondary H?A L_OCCIP within normal limits 07/1999//CT of head without Normal ([facility]) 12/21/2007  . Hyperlipidemia   . NSVD (normal spontaneous vaginal delivery)    x 2  . Osteoporosis    Past Surgical History:  Procedure Laterality Date  . ABDOMINAL HYSTERECTOMY  11/99   partial hysterectomy ovaries intact (fibroids  . [facility]    . stress cardiolite  04/28/2001   stress cardiolite wnl, EF 69%  . TUBAL LIGATION  1969   Family History  Problem Relation Age of Onset  . Heart disease Mother 6467    CHF  . Cancer Mother 350    lung cancer since her 5550's with kidney cancer //cancer of the throat and tongue //never smoked or drank  . Diabetes Mother     borderline  . Cancer Father 876    melanoma  . Cancer Sister     breast cancer and ovarian  . Breast cancer Sister   . Heart disease Paternal Grandfather     MI  . Diabetes Brother   . Cancer Brother     lung cancer  . Stroke Maternal Grandmother   . Depression Maternal Grandfather   . Colon  cancer Neg Hx    History  Sexual Activity  . Sexual activity: No    Comment: Husband with ED    Outpatient Encounter Prescriptions as of 06/13/2016  Medication Sig  . cetirizine (ZYRTEC) 10 MG tablet Take 10 mg by mouth daily.    . Cholecalciferol ([employer]S VITAMIN D3) 1000 UNITS capsule Take 2 capsules (2,000 Units total) by mouth daily. (Patient taking differently: Take 4,000 Units by mouth daily. )  . clobetasol cream (TEMOVATE) 0.05 % apply to affected area twice a day (Patient taking differently: apply to affected area twice a day AS NEEDED)  . fluticasone (FLONASE) 50 MCG/ACT nasal spray Place 2 sprays into both nostrils as needed.   . pravastatin (PRAVACHOL) 40 MG tablet take 1 tablet by mouth once daily  . alendronate (FOSAMAX) 70 MG tablet Held as of 01/15/16 (Patient not taking: Reported on 06/13/2016)  . rizatriptan (MAXALT-MLT) 10 MG disintegrating tablet Take 10 mg by mouth as needed. May repeat in 2 hours if needed    No facility-administered encounter medications on file as of 06/13/2016.     Activities of Daily Living In your present state of health,  do you have any difficulty performing the following activities: 06/13/2016  Hearing? Y  Vision? N  Difficulty concentrating or making decisions? N  Walking or climbing stairs? N  Dressing or bathing? N  Doing errands, shopping? N  Preparing Food and eating ? N  Using the Toilet? N  In the past six months, have you accidently leaked urine? N  Do you have problems with loss of bowel control? N  Managing your Medications? N  Managing your Finances? N  Housekeeping or managing your Housekeeping? N  Some recent data might be hidden    Patient Care Team: Joaquim Nam, MD as PCP - General (Family Medicine)    Assessment:     Visual Acuity Screening   Right eye Left eye Both eyes  Without correction: 20/13 20/13 20/13-1  With correction:     Hearing Screening Comments: Audiology exam revealed hearing loss and use of a  hearing aid in right ear.    Exercise Activities and Dietary recommendations Current Exercise Habits: Home exercise routine, Type of exercise: walking, Time (Minutes): 30, Frequency (Times/Week): 7, Weekly Exercise (Minutes/Week): 210, Exercise limited by: None identified  Goals    . Increase physical activity          Starting 06/13/2016, I will continue to walk daily for 30 min.      Fall Risk Fall Risk  06/13/2016 12/09/2014  Falls in the past year? No Yes   Depression Screen PHQ 2/9 Scores 06/13/2016 12/09/2014  PHQ - 2 Score 0 0     Cognitive Testing MMSE - Mini Mental State Exam 06/13/2016  Orientation to time 5  Orientation to Place 5  Registration 3  Attention/ Calculation 0  Recall 3  Language- name 2 objects 0  Language- repeat 1  Language- follow 3 step command 3  Language- read & follow direction 0  Write a sentence 0  Copy design 0  Total score 20   PLEASE NOTE: A Mini-Cog screen was completed. Maximum score is 20. A value of 0 denotes this part of Folstein MMSE was not completed or the patient failed this part of the Mini-Cog screening.   Mini-Cog Screening Orientation to Time - Max 5 pts Orientation to Place - Max 5 pts Registration - Max 3 pts Recall - Max 3 pts Language Repeat - Max 1 pts Language Follow 3 Step Command - Max 3 pts  Immunization History  Administered Date(s) Administered  . Influenza Split 07/03/2012, 06/25/2013  . Influenza-Unspecified 06/17/2014  . Pneumococcal Polysaccharide-23 10/29/2013  . Td 10/11/1999  . Tdap 03/29/2011   Screening Tests Health Maintenance  Topic Date Due  . Hepatitis C Screening  06/22/2016 (Originally 1946-03-28)  . COLONOSCOPY  06/13/2017 (Originally 03/11/1996)  . ZOSTAVAX  06/13/2026 (Originally 03/11/2006)  . MAMMOGRAM  12/20/2016  . TETANUS/TDAP  03/28/2021  . INFLUENZA VACCINE  Addressed  . DEXA SCAN  Completed  . PNA vac Low Risk Adult  Addressed      Plan:     I have personally reviewed and  addressed the Medicare Annual Wellness questionnaire and have noted the following in the patient's chart:  A. Medical and social history B. Use of alcohol, tobacco or illicit drugs  C. Current medications and supplements D. Functional ability and status E.  Nutritional status F.  Physical activity G. Advance directives H. List of other physicians I.  Hospitalizations, surgeries, and ER visits in previous 12 months J.  Vitals K. Screenings to include hearing, vision, cognitive, depression L.  Referrals and appointments - none  In addition, I have reviewed and discussed with patient certain preventive protocols, quality metrics, and best practice recommendations. A written personalized care plan for preventive services as well as general preventive health recommendations were provided to patient.  See attached scanned questionnaire for additional information.   Signed,   Randa Evens, MHA, BS, LPN Health Advisor

## 2016-06-13 NOTE — Progress Notes (Signed)
PCP notes:   Health maintenance:  Flu vaccine - per pt, vaccine given 05/07/16 PCV13 - per pt, vaccine given 05/07/16 Shingles - pt declined due to cost Hep C screening - will be completed with future labs Colon cancer screening - pt encouraged to discuss with PCP at CPE  Abnormal screenings:   None  Patient concerns:   None  Nurse concerns:  None  Next PCP appt:   06/24/2016 @ 1130  I reviewed health advisor's note, was available for consultation on the day of service listed in this note, and agree with documentation and plan. Crawford GivensGraham Duncan, MD.

## 2016-06-16 ENCOUNTER — Other Ambulatory Visit: Payer: Self-pay | Admitting: Family Medicine

## 2016-06-16 DIAGNOSIS — M810 Age-related osteoporosis without current pathological fracture: Secondary | ICD-10-CM

## 2016-06-16 DIAGNOSIS — Z119 Encounter for screening for infectious and parasitic diseases, unspecified: Secondary | ICD-10-CM

## 2016-06-16 DIAGNOSIS — E7800 Pure hypercholesterolemia, unspecified: Secondary | ICD-10-CM

## 2016-06-17 ENCOUNTER — Other Ambulatory Visit (INDEPENDENT_AMBULATORY_CARE_PROVIDER_SITE_OTHER): Payer: Medicare Other

## 2016-06-17 DIAGNOSIS — E7800 Pure hypercholesterolemia, unspecified: Secondary | ICD-10-CM | POA: Diagnosis not present

## 2016-06-17 DIAGNOSIS — Z119 Encounter for screening for infectious and parasitic diseases, unspecified: Secondary | ICD-10-CM

## 2016-06-17 DIAGNOSIS — M810 Age-related osteoporosis without current pathological fracture: Secondary | ICD-10-CM

## 2016-06-17 DIAGNOSIS — Z7289 Other problems related to lifestyle: Secondary | ICD-10-CM | POA: Diagnosis not present

## 2016-06-17 LAB — COMPREHENSIVE METABOLIC PANEL
ALK PHOS: 57 U/L (ref 39–117)
ALT: 12 U/L (ref 0–35)
AST: 20 U/L (ref 0–37)
Albumin: 3.8 g/dL (ref 3.5–5.2)
BILIRUBIN TOTAL: 0.9 mg/dL (ref 0.2–1.2)
BUN: 13 mg/dL (ref 6–23)
CO2: 29 meq/L (ref 19–32)
CREATININE: 0.99 mg/dL (ref 0.40–1.20)
Calcium: 8.8 mg/dL (ref 8.4–10.5)
Chloride: 107 mEq/L (ref 96–112)
GFR: 58.89 mL/min — AB (ref >60.00)
GLUCOSE: 87 mg/dL (ref 70–99)
Potassium: 3.7 mEq/L (ref 3.5–5.1)
Sodium: 144 mEq/L (ref 135–145)
TOTAL PROTEIN: 6.7 g/dL (ref 6.0–8.3)

## 2016-06-17 LAB — LIPID PANEL
CHOL/HDL RATIO: 4
Cholesterol: 178 mg/dL (ref 0–200)
HDL: 48.1 mg/dL (ref >39.00)
LDL Cholesterol: 111 mg/dL — ABNORMAL HIGH (ref 0–99)
NONHDL: 130.23
Triglycerides: 98 mg/dL (ref 0.0–149.0)
VLDL: 19.6 mg/dL (ref 0.0–40.0)

## 2016-06-17 LAB — VITAMIN D 25 HYDROXY (VIT D DEFICIENCY, FRACTURES): VITD: 27.47 ng/mL — AB (ref 30.00–100.00)

## 2016-06-18 LAB — HEPATITIS C ANTIBODY: HCV AB: NEGATIVE

## 2016-06-24 ENCOUNTER — Encounter: Payer: Self-pay | Admitting: Family Medicine

## 2016-06-24 ENCOUNTER — Other Ambulatory Visit: Payer: Self-pay | Admitting: Family Medicine

## 2016-06-24 ENCOUNTER — Ambulatory Visit (INDEPENDENT_AMBULATORY_CARE_PROVIDER_SITE_OTHER): Payer: Medicare Other | Admitting: Family Medicine

## 2016-06-24 VITALS — BP 122/82 | HR 62 | Temp 97.6°F | Ht 65.0 in | Wt 189.5 lb

## 2016-06-24 DIAGNOSIS — M810 Age-related osteoporosis without current pathological fracture: Secondary | ICD-10-CM

## 2016-06-24 DIAGNOSIS — E7800 Pure hypercholesterolemia, unspecified: Secondary | ICD-10-CM

## 2016-06-24 DIAGNOSIS — Z0000 Encounter for general adult medical examination without abnormal findings: Secondary | ICD-10-CM | POA: Diagnosis not present

## 2016-06-24 DIAGNOSIS — G43909 Migraine, unspecified, not intractable, without status migrainosus: Secondary | ICD-10-CM | POA: Diagnosis not present

## 2016-06-24 DIAGNOSIS — R1310 Dysphagia, unspecified: Secondary | ICD-10-CM | POA: Diagnosis not present

## 2016-06-24 DIAGNOSIS — Z1231 Encounter for screening mammogram for malignant neoplasm of breast: Secondary | ICD-10-CM

## 2016-06-24 MED ORDER — PRAVASTATIN SODIUM 40 MG PO TABS: 40.0000 mg | ORAL_TABLET | Freq: Every day | ORAL | 3 refills | Status: DC

## 2016-06-24 MED ORDER — RIZATRIPTAN BENZOATE 10 MG PO TBDP: 10.0000 mg | ORAL_TABLET | ORAL | 2 refills | Status: DC | PRN

## 2016-06-24 NOTE — Progress Notes (Signed)
Flu vaccine - per pt, vaccine given 05/07/16 PCV13 - per pt, vaccine given 05/07/16 Shingles - pt declined due to cost Hep C screening - negative, d/w pt.  Colon cancer screening- d/w pt about GI referral.  See below.   Mammogram pending, patient to call about scheduling.   DXA declined for now.  She couldn't tolerate fosamax.  She feels better off fosamax.  Swallowing improved in the meantime, but still have mucous sticking in the esophagus that she'll have to clear with regurgitation.  She has sig post nasal gtt.    Advance directive- would have husband designated if she were incapacitated.   Elevated Cholesterol: Using medications without problems:yes Muscle aches: no Diet compliance:yes Exercise:yes  Rare migraines, no ADE on med.    PMH and SH reviewed  ROS: Per HPI unless specifically indicated in ROS section   Meds, vitals, and allergies reviewed.   GEN: nad, alert and oriented HEENT: mucous membranes moist NECK: supple w/o LA CV: rrr PULM: ctab, no inc wob ABD: soft, +bs EXT: no edema SKIN: no acute rash

## 2016-06-24 NOTE — Patient Instructions (Signed)
Shirlee LimerickMarion will call about your referral. Take care.  Glad to see you.  Don't change your meds for now.   Update me as needed.

## 2016-06-24 NOTE — Progress Notes (Signed)
Pre visit review using our clinic review tool, if applicable. No additional management support is needed unless otherwise documented below in the visit note. 

## 2016-06-25 DIAGNOSIS — R1310 Dysphagia, unspecified: Secondary | ICD-10-CM | POA: Insufficient documentation

## 2016-06-25 DIAGNOSIS — Z0000 Encounter for general adult medical examination without abnormal findings: Secondary | ICD-10-CM | POA: Insufficient documentation

## 2016-06-25 NOTE — Assessment & Plan Note (Signed)
Flu vaccine - per pt, vaccine given 05/07/16 PCV13 - per pt, vaccine given 05/07/16 Shingles - pt declined due to cost Hep C screening - negative, d/w pt.  Colon cancer screening- d/w pt about GI referral.  See below.   Mammogram pending, patient to call about scheduling.   DXA declined for now.

## 2016-06-25 NOTE — Assessment & Plan Note (Signed)
Defer further treatment at this point. Her swallowing is better off of Fosamax, but she is still having troubles as described above. See discussion of dysphagia.

## 2016-06-25 NOTE — Assessment & Plan Note (Signed)
Now fortunately with rare symptoms.

## 2016-06-25 NOTE — Assessment & Plan Note (Signed)
She is still having some dysphagia. She has a lot of postnasal drip, to the point of having to regurgitate mucus that is sticking in the esophagus. She is already off of Fosamax. Discussed with patient. Refer to GI. >25 minutes spent in face to face time with patient, >50% spent in counselling or coordination of care.

## 2016-06-25 NOTE — Assessment & Plan Note (Signed)
Reasonable control her lipids. Discussed with patient. Continue pravastatin.

## 2016-07-03 ENCOUNTER — Ambulatory Visit: Payer: Self-pay | Admitting: Nurse Practitioner

## 2016-07-23 ENCOUNTER — Ambulatory Visit
Admission: RE | Admit: 2016-07-23 | Discharge: 2016-07-23 | Disposition: A | Discharge status: Home / Self Care | Payer: Medicare Other | Source: Ambulatory Visit | Attending: Family Medicine | Admitting: Family Medicine

## 2016-07-23 DIAGNOSIS — Z1231 Encounter for screening mammogram for malignant neoplasm of breast: Secondary | ICD-10-CM

## 2016-07-25 ENCOUNTER — Encounter: Payer: Self-pay | Admitting: *Deleted

## 2016-08-28 ENCOUNTER — Ambulatory Visit: Payer: Self-pay | Admitting: Gastroenterology

## 2016-11-04 ENCOUNTER — Other Ambulatory Visit: Payer: Self-pay | Admitting: Family Medicine

## 2016-11-04 MED ORDER — ONDANSETRON HCL 4 MG PO TABS: 4.0000 mg | ORAL_TABLET | Freq: Three times a day (TID) | ORAL | 0 refills | Status: DC | PRN

## 2016-11-04 NOTE — Progress Notes (Signed)
Called pt.  She has NAV with diarrhea.  GI illness present in the community recently.  Sent zofran for patient.  If she isn't getting better, then she'll update me.  She agrees with plan.

## 2017-06-12 ENCOUNTER — Encounter: Payer: Self-pay | Admitting: Family Medicine

## 2017-08-07 ENCOUNTER — Telehealth: Payer: Self-pay

## 2017-08-07 NOTE — Telephone Encounter (Signed)
Copied from CRM [phone]. Topic: Inquiry >> Aug 07, 2017  8:52 AM Arlyss Gandyichardson, Taren N, NT wrote: Reason for CRM: Patient needs a letter stating that she is deaf in her left ear to be able to get out of jury duty.   >> Aug 07, 2017  8:54 AM Arlyss Gandyichardson, Taren N, NT wrote: Patient needs letter before 11/29. She will pick it up at the office. Please call when ready.

## 2017-08-08 NOTE — Telephone Encounter (Signed)
Done. Printed. Thanks.

## 2017-08-08 NOTE — Telephone Encounter (Signed)
Placed at front desk for pickup on Monday at husband's appt.

## 2017-09-27 ENCOUNTER — Other Ambulatory Visit: Payer: Self-pay | Admitting: Family Medicine

## 2017-10-18 ENCOUNTER — Encounter: Payer: Self-pay | Admitting: Emergency Medicine

## 2017-10-18 ENCOUNTER — Other Ambulatory Visit: Payer: Self-pay

## 2017-10-18 ENCOUNTER — Emergency Department: Payer: Medicare Other

## 2017-10-18 ENCOUNTER — Observation Stay
Admission: EM | Admit: 2017-10-18 | Discharge: 2017-10-20 | Disposition: A | Discharge status: Home / Self Care | Payer: Medicare Other | Attending: Internal Medicine | Admitting: Internal Medicine

## 2017-10-18 DIAGNOSIS — J309 Allergic rhinitis, unspecified: Secondary | ICD-10-CM | POA: Diagnosis not present

## 2017-10-18 DIAGNOSIS — Z888 Allergy status to other drugs, medicaments and biological substances status: Secondary | ICD-10-CM | POA: Diagnosis not present

## 2017-10-18 DIAGNOSIS — H9190 Unspecified hearing loss, unspecified ear: Secondary | ICD-10-CM | POA: Insufficient documentation

## 2017-10-18 DIAGNOSIS — Z818 Family history of other mental and behavioral disorders: Secondary | ICD-10-CM | POA: Insufficient documentation

## 2017-10-18 DIAGNOSIS — M47814 Spondylosis without myelopathy or radiculopathy, thoracic region: Secondary | ICD-10-CM | POA: Diagnosis not present

## 2017-10-18 DIAGNOSIS — R079 Chest pain, unspecified: Secondary | ICD-10-CM | POA: Diagnosis present

## 2017-10-18 DIAGNOSIS — E7800 Pure hypercholesterolemia, unspecified: Secondary | ICD-10-CM | POA: Diagnosis not present

## 2017-10-18 DIAGNOSIS — Z801 Family history of malignant neoplasm of trachea, bronchus and lung: Secondary | ICD-10-CM | POA: Insufficient documentation

## 2017-10-18 DIAGNOSIS — E785 Hyperlipidemia, unspecified: Secondary | ICD-10-CM | POA: Diagnosis not present

## 2017-10-18 DIAGNOSIS — R0789 Other chest pain: Secondary | ICD-10-CM | POA: Diagnosis not present

## 2017-10-18 DIAGNOSIS — Z885 Allergy status to narcotic agent status: Secondary | ICD-10-CM | POA: Insufficient documentation

## 2017-10-18 DIAGNOSIS — Z800 Family history of malignant neoplasm of digestive organs: Secondary | ICD-10-CM | POA: Diagnosis not present

## 2017-10-18 DIAGNOSIS — Z803 Family history of malignant neoplasm of breast: Secondary | ICD-10-CM | POA: Insufficient documentation

## 2017-10-18 DIAGNOSIS — Z8041 Family history of malignant neoplasm of ovary: Secondary | ICD-10-CM | POA: Diagnosis not present

## 2017-10-18 DIAGNOSIS — Z808 Family history of malignant neoplasm of other organs or systems: Secondary | ICD-10-CM | POA: Diagnosis not present

## 2017-10-18 DIAGNOSIS — Z8051 Family history of malignant neoplasm of kidney: Secondary | ICD-10-CM | POA: Insufficient documentation

## 2017-10-18 DIAGNOSIS — I200 Unstable angina: Secondary | ICD-10-CM | POA: Diagnosis not present

## 2017-10-18 DIAGNOSIS — Z90710 Acquired absence of both cervix and uterus: Secondary | ICD-10-CM | POA: Insufficient documentation

## 2017-10-18 DIAGNOSIS — Z79899 Other long term (current) drug therapy: Secondary | ICD-10-CM | POA: Diagnosis not present

## 2017-10-18 DIAGNOSIS — Z87891 Personal history of nicotine dependence: Secondary | ICD-10-CM | POA: Insufficient documentation

## 2017-10-18 DIAGNOSIS — R110 Nausea: Secondary | ICD-10-CM | POA: Diagnosis not present

## 2017-10-18 DIAGNOSIS — Z823 Family history of stroke: Secondary | ICD-10-CM | POA: Diagnosis not present

## 2017-10-18 DIAGNOSIS — R0600 Dyspnea, unspecified: Secondary | ICD-10-CM | POA: Diagnosis not present

## 2017-10-18 DIAGNOSIS — M25569 Pain in unspecified knee: Secondary | ICD-10-CM | POA: Insufficient documentation

## 2017-10-18 DIAGNOSIS — R4702 Dysphasia: Secondary | ICD-10-CM | POA: Insufficient documentation

## 2017-10-18 DIAGNOSIS — R61 Generalized hyperhidrosis: Secondary | ICD-10-CM | POA: Diagnosis not present

## 2017-10-18 DIAGNOSIS — K219 Gastro-esophageal reflux disease without esophagitis: Secondary | ICD-10-CM | POA: Diagnosis not present

## 2017-10-18 DIAGNOSIS — G43909 Migraine, unspecified, not intractable, without status migrainosus: Secondary | ICD-10-CM | POA: Diagnosis not present

## 2017-10-18 DIAGNOSIS — R1310 Dysphagia, unspecified: Secondary | ICD-10-CM | POA: Diagnosis not present

## 2017-10-18 DIAGNOSIS — Z7982 Long term (current) use of aspirin: Secondary | ICD-10-CM | POA: Insufficient documentation

## 2017-10-18 DIAGNOSIS — I4510 Unspecified right bundle-branch block: Secondary | ICD-10-CM | POA: Diagnosis not present

## 2017-10-18 DIAGNOSIS — R197 Diarrhea, unspecified: Secondary | ICD-10-CM | POA: Diagnosis not present

## 2017-10-18 DIAGNOSIS — I071 Rheumatic tricuspid insufficiency: Secondary | ICD-10-CM | POA: Insufficient documentation

## 2017-10-18 LAB — BASIC METABOLIC PANEL
ANION GAP: 7 (ref 5–15)
BUN: 16 mg/dL (ref 6–20)
CHLORIDE: 106 mmol/L (ref 101–111)
CO2: 27 mmol/L (ref 22–32)
Calcium: 9.3 mg/dL (ref 8.9–10.3)
Creatinine, Ser: 1.14 mg/dL — ABNORMAL HIGH (ref 0.44–1.00)
GFR calc Af Amer: 55 mL/min — ABNORMAL LOW (ref >60)
GFR calc non Af Amer: 47 mL/min — ABNORMAL LOW (ref >60)
Glucose, Bld: 111 mg/dL — ABNORMAL HIGH (ref 65–99)
POTASSIUM: 4 mmol/L (ref 3.5–5.1)
SODIUM: 140 mmol/L (ref 135–145)

## 2017-10-18 LAB — CBC
HEMATOCRIT: 45 % (ref 35.0–47.0)
HEMOGLOBIN: 15 g/dL (ref 12.0–16.0)
MCH: 27.9 pg (ref 26.0–34.0)
MCHC: 33.4 g/dL (ref 32.0–36.0)
MCV: 83.4 fL (ref 80.0–100.0)
Platelets: 205 10*3/uL (ref 150–440)
RBC: 5.4 MIL/uL — ABNORMAL HIGH (ref 3.80–5.20)
RDW: 13.6 % (ref 11.5–14.5)
WBC: 5.1 10*3/uL (ref 3.6–11.0)

## 2017-10-18 LAB — TROPONIN I
Troponin I: <0.03 ng/mL (ref <0.03)
Troponin I: <0.03 ng/mL (ref <0.03)
Troponin I: <0.03 ng/mL (ref <0.03)

## 2017-10-18 MED ORDER — FLUTICASONE PROPIONATE 50 MCG/ACT NA SUSP
2.0000 | Freq: Every day | NASAL | Status: DC
  Filled 2017-10-18: qty 16

## 2017-10-18 MED ORDER — ACETAMINOPHEN 325 MG PO TABS: 650.0000 mg | ORAL_TABLET | ORAL | Status: DC | PRN

## 2017-10-18 MED ORDER — GI COCKTAIL ~~LOC~~
30.0000 mL | Freq: Four times a day (QID) | ORAL | Status: DC | PRN
  Filled 2017-10-18: qty 30

## 2017-10-18 MED ORDER — ENOXAPARIN SODIUM 40 MG/0.4ML ~~LOC~~ SOLN
40.0000 mg | SUBCUTANEOUS | Status: DC
  Administered 2017-10-19: 40 mg via SUBCUTANEOUS
  Filled 2017-10-18: qty 0.4

## 2017-10-18 MED ORDER — FLORANEX PO PACK
1.0000 g | PACK | Freq: Three times a day (TID) | ORAL | Status: DC
  Administered 2017-10-18: 1 g via ORAL
  Filled 2017-10-18 (×4): qty 1

## 2017-10-18 MED ORDER — VITAMIN D 1000 UNITS PO TABS
2000.0000 [IU] | ORAL_TABLET | Freq: Every day | ORAL | Status: DC
  Administered 2017-10-19 – 2017-10-20 (×2): 2000 [IU] via ORAL
  Filled 2017-10-18 (×3): qty 2

## 2017-10-18 MED ORDER — MORPHINE SULFATE (PF) 2 MG/ML IV SOLN: 2.0000 mg | INTRAVENOUS | Status: DC | PRN

## 2017-10-18 MED ORDER — ASPIRIN 325 MG PO TABS
325.0000 mg | ORAL_TABLET | Freq: Every day | ORAL | Status: DC
  Administered 2017-10-18 – 2017-10-19 (×2): 325 mg via ORAL
  Filled 2017-10-18 (×2): qty 1

## 2017-10-18 MED ORDER — LORATADINE 10 MG PO TABS
10.0000 mg | ORAL_TABLET | Freq: Every day | ORAL | Status: DC
  Administered 2017-10-19 – 2017-10-20 (×2): 10 mg via ORAL
  Filled 2017-10-18 (×2): qty 1

## 2017-10-18 MED ORDER — NITROGLYCERIN 0.4 MG SL SUBL: 0.4000 mg | SUBLINGUAL_TABLET | SUBLINGUAL | Status: DC | PRN

## 2017-10-18 MED ORDER — ONDANSETRON HCL 4 MG/2ML IJ SOLN: 4.0000 mg | Freq: Four times a day (QID) | INTRAMUSCULAR | Status: DC | PRN

## 2017-10-18 MED ORDER — METOPROLOL TARTRATE 25 MG PO TABS
12.5000 mg | ORAL_TABLET | Freq: Two times a day (BID) | ORAL | Status: DC
  Administered 2017-10-19: 12.5 mg via ORAL
  Filled 2017-10-18: qty 1

## 2017-10-18 MED ORDER — PRAVASTATIN SODIUM 40 MG PO TABS
40.0000 mg | ORAL_TABLET | Freq: Every day | ORAL | Status: DC
  Administered 2017-10-19 – 2017-10-20 (×2): 40 mg via ORAL
  Filled 2017-10-18 (×2): qty 1

## 2017-10-18 MED ORDER — ALPRAZOLAM 0.25 MG PO TABS: 0.2500 mg | ORAL_TABLET | Freq: Two times a day (BID) | ORAL | Status: DC | PRN

## 2017-10-18 MED ORDER — ASPIRIN 81 MG PO CHEW
324.0000 mg | CHEWABLE_TABLET | Freq: Once | ORAL | Status: AC
  Administered 2017-10-18: 324 mg via ORAL
  Filled 2017-10-18: qty 4

## 2017-10-18 MED ORDER — RIZATRIPTAN BENZOATE 10 MG PO TBDP: 10.0000 mg | ORAL_TABLET | Freq: Every day | ORAL | Status: DC | PRN

## 2017-10-18 MED ORDER — PANTOPRAZOLE SODIUM 40 MG IV SOLR
40.0000 mg | Freq: Two times a day (BID) | INTRAVENOUS | Status: DC
  Administered 2017-10-18 – 2017-10-19 (×2): 40 mg via INTRAVENOUS
  Filled 2017-10-18 (×2): qty 40

## 2017-10-18 NOTE — ED Provider Notes (Signed)
[facility] Emergency Department Provider Note  ____________________________________________  Time seen: Approximately 3:56 PM  I have reviewed the triage vital signs and the nursing notes.   HISTORY  Chief Complaint Chest Pain    HPI Rebecca Moran is a 72 y.o. female who complains of central chest pain described as severe heaviness radiating to her jaw that woke her up this morning at about 7:00 AM. Was associated with nausea and diaphoresis but not shortness of breath. Not positional. With walking around in the house and taking her husband's breakfast, the pain was worse. She sat in a recliner and rested and the pain improved. The pain was constant and lasted for approximately 4 hours.   She's had similar pain like this once before in July 2018 when she was doing some strenuous yard work and got severe heaviness chest pain with diaphoresis and vomiting that improved with going inside and resting. Chest pain is currently resolved. She is also recently been having dyspnea on exertion and decreased exercise tolerance.  This pain is different from her chronic daily chest discomfort related to acid reflux. She takes antiacids regularly for this, but this morning's pain was different. She also reports that for the past year she's been having discomfort with eating and sometimes feels like food gets stuck in her throat and she has to vomit it back up. She had been scheduled to see gastroenterology for evaluation a few weeks ago but this appointment was canceled by the patient and has not yet been rescheduled      Past Medical History:  Diagnosis Date  . Allergy    seasonal  . Family history of cancer   . Hearing loss    in L ear  . History of migraines    MRI -head,secondary H?A L_OCCIP within normal limits 07/1999//CT of head without Normal ([facility]) 12/21/2007  . Hyperlipidemia   . NSVD (normal spontaneous vaginal delivery)    x 2  . Osteoporosis       Patient Active Problem List   Diagnosis Date Noted  . Healthcare maintenance 06/25/2016  . Dysphagia 06/25/2016  . Advance care planning 12/12/2014  . Knee pain 10/31/2013  . Osteoporosis 07/22/2012  . Hard of hearing 07/05/2012  . Medicare annual wellness visit, subsequent 07/05/2012  . Hypercholesteremia 03/29/2011  . ALLERGIC RHINITIS, SEASONAL 03/24/2007  . Migraine 03/24/2007     Past Surgical History:  Procedure Laterality Date  . ABDOMINAL HYSTERECTOMY  11/99   partial hysterectomy ovaries intact (fibroids  . normal vaginal delivery x2 [facility]    . stress cardiolite  04/28/2001   stress cardiolite wnl, EF 69%  . TUBAL LIGATION  1969     Prior to Admission medications   Medication Sig Start Date End Date Taking? Authorizing Provider  cetirizine (ZYRTEC) 10 MG tablet Take 10 mg by mouth daily.      [provider]  Cholecalciferol ([employer] VITAMIN D3) 1000 UNITS capsule Take 2 capsules (2,000 Units total) by mouth daily. 12/09/14   Joaquim Nam, MD  fluticasone Aleda Grana) 50 MCG/ACT nasal spray Place 2 sprays into both nostrils as needed.     [provider]  ondansetron (ZOFRAN) 4 MG tablet Take 1 tablet (4 mg total) by mouth every 8 (eight) hours as needed for nausea or vomiting. 11/04/16   Joaquim Nam, MD  pravastatin (PRAVACHOL) 40 MG tablet take 1 tablet by mouth once daily 09/29/17   Joaquim Nam, MD  rizatriptan (MAXALT-MLT) 10  MG disintegrating tablet Take 1 tablet (10 mg total) by mouth as needed. May repeat in 2 hours if needed 06/24/16   Joaquim Nam, MD     Allergies Crestor [rosuvastatin calcium]; Fosamax [alendronate sodium]; Oxycodone-acetaminophen; Prednisone; and Sumatriptan   Family History  Problem Relation Age of Onset  . Heart disease Mother 28       CHF  . Cancer Mother 13       lung cancer since her 34's with kidney cancer //cancer of the throat and tongue //never smoked or drank  . Diabetes  Mother        borderline  . Cancer Father 22       melanoma  . Cancer Sister        breast cancer and ovarian  . Breast cancer Sister   . Ovarian cancer Sister   . Bone cancer Sister   . Heart disease Paternal Grandfather        MI  . Diabetes Brother   . Cancer Brother        lung cancer  . Stroke Maternal Grandmother   . Depression Maternal Grandfather   . Colon cancer Neg Hx     Social History Social History   Tobacco Use  . Smoking status: Former Smoker    Packs/day: 2.00    Years: 22.00    Pack years: 44.00    Last attempt to quit: 09/23/1981    Years since quitting: 36.0  . Smokeless tobacco: Never Used  . Tobacco comment: quit in 1975  Substance Use Topics  . Alcohol use: No  . Drug use: No    Review of Systems  Constitutional:   No fever or chills.  ENT:   No sore throat. No rhinorrhea. Cardiovascular:   Positive as above chest pain without syncope. Respiratory:   Positive dyspnea without cough. Gastrointestinal:   Negative for abdominal pain, vomiting and diarrhea.  Musculoskeletal:   Negative for focal pain or swelling All other systems reviewed and are negative except as documented above in ROS and HPI.  ____________________________________________   PHYSICAL EXAM:  VITAL SIGNS: ED Triage Vitals  Enc Vitals Group     BP 10/18/17 1152 115/86     Pulse Rate 10/18/17 1152 83     Resp 10/18/17 1152 18     Temp 10/18/17 1152 97.6 F (36.4 C)     Temp Source 10/18/17 1152 Oral     SpO2 10/18/17 1152 97 %     Weight 10/18/17 1154 189 lb (85.7 kg)     Height 10/18/17 1154 5\' 5"  (1.651 m)     Head Circumference --      Peak Flow --      Pain Score 10/18/17 1154 5     Pain Loc --      Pain Edu? --      Excl. in GC? --     Vital signs reviewed, nursing assessments reviewed.   Constitutional:   Alert and oriented. Not in distress. Eyes:   No scleral icterus.  EOMI. No nystagmus. No conjunctival pallor. PERRL. ENT   Head:   Normocephalic and  atraumatic.   Nose:   No congestion/rhinnorhea.    Mouth/Throat:   MMM, no pharyngeal erythema. No peritonsillar mass.    Neck:   No meningismus. Full ROM. Hematological/Lymphatic/Immunilogical:   No cervical lymphadenopathy. Cardiovascular:   RRR. Symmetric bilateral radial and DP pulses.  No murmurs.  Respiratory:   Normal respiratory effort without tachypnea/retractions. Breath sounds are  clear and equal bilaterally. No wheezes/rales/rhonchi. Gastrointestinal:   Soft and nontender. Non distended. There is no CVA tenderness.  No rebound, rigidity, or guarding. Genitourinary:   deferred Musculoskeletal:   Normal range of motion in all extremities. No joint effusions.  No lower extremity tenderness.  No edema. Chest wall nontender, chest pain not reproducible Neurologic:   Normal speech and language.  Motor grossly intact. No acute focal neurologic deficits are appreciated.  Skin:    Skin is warm, dry and intact. No rash noted.  No petechiae, purpura, or bullae.  ____________________________________________    LABS (pertinent positives/negatives) (all labs ordered are listed, but only abnormal results are displayed) Labs Reviewed  BASIC METABOLIC PANEL - Abnormal; Notable for the following components:      Result Value   Glucose, Bld 111 (*)    Creatinine, Ser 1.14 (*)    GFR calc non Af Amer 47 (*)    GFR calc Af Amer 55 (*)    All other components within normal limits  CBC - Abnormal; Notable for the following components:   RBC 5.40 (*)    All other components within normal limits  TROPONIN I   ____________________________________________   EKG  Interpreted by me Normal sinus rhythm rate of 83, left axis, normal intervals. Right bundle-branch block. Normal ST segments and T waves, no acute ischemic changes. Only prior EKG for comparison is from 2009. Compared to this, there is new right bundle-branch block.  ____________________________________________     RADIOLOGY  Dg Chest 2 View  Result Date: 10/18/2017 CLINICAL DATA:  Chest pain and reflux symptoms EXAM: CHEST  2 VIEW COMPARISON:  12/21/2007 FINDINGS: Cardiac shadow is stable. Mild aortic calcifications are again seen. The lungs are well aerated bilaterally. Mild interstitial changes are noted without focal infiltrate. No sizable effusion is seen. Degenerative change of the thoracic spine is noted. IMPRESSION: Mild interstitial changes which may represent some bronchitic change. No focal infiltrate is noted. Electronically Signed   By: Alcide CleverMark  Lukens M.D.   On: 10/18/2017 13:54    ____________________________________________   PROCEDURES Procedures  ____________________________________________    CLINICAL IMPRESSION / ASSESSMENT AND PLAN / ED COURSE  Pertinent labs & imaging results that were available during my care of the patient were reviewed by me and considered in my medical decision making (see chart for details).   Patient presents with chest pain concerning for cardiac etiology. Presentation is not consistent with unstable angina ACS at this time, but given her risk factors including age and hyperlipidemia as well as the worrisome nature of the history of present illness, she warrants further workup. Chest x-ray is unremarkable without evidence of pneumothorax or pneumonia. EKG is nonischemic, initial labs don't show any evidence of acute MI and are otherwise unremarkable with her electrolytes and blood counts.  Recommended hospitalization at this time for further cardiac workup which patient agrees.  Also advised her that she'll need to follow up with gastroenterology regarding her chronic dysphagia symptoms which are suggestive of esophageal stricture.      ____________________________________________   FINAL CLINICAL IMPRESSION(S) / ED DIAGNOSES    Final diagnoses:  Chest pain with moderate risk for cardiac etiology       Portions of this note were  generated with dragon dictation software. Dictation errors may occur despite best attempts at proofreading.    Sharman CheekStafford, Larken Urias, MD 10/18/17 [phone]

## 2017-10-18 NOTE — ED Notes (Signed)
Pt given graham crackers and peanut butter with coke.  

## 2017-10-18 NOTE — ED Triage Notes (Signed)
Pt states woke up this morning with substernal chest pain that started this morning. Pt states "my heart felt like it was quivering", pt states pain felt like a "ton of bricks". Pt also c/o indigestion that started last night. Pt also c/o SHOB with walking and diaphoresis. Pt states took an advil PTA.

## 2017-10-18 NOTE — H&P (Signed)
[employer] [facility] [place] [facility]   PATIENT NAME: Rebecca Moran    MR#:  [id number]  DATE OF BIRTH:  Nov 07, 1945  DATE OF ADMISSION:  10/18/2017  PRIMARY CARE PHYSICIAN: Joaquim Nam, MD   REQUESTING/REFERRING PHYSICIAN:   CHIEF COMPLAINT:   Chief Complaint  Patient presents with  . Chest Pain    HISTORY OF PRESENT ILLNESS: Rebecca Moran  is a 72 y.o. female with a known history per below presenting with acute chest pressure/heaviness, feels like a bricks laying on her anterior chest for 30-50 minutes, dyspnea on exertion, nausea, diaphoresis, "heart quivering", lasting 30-50 minutes, radiating into the jaw, had similar episode in July 2018 associated with nausea/emesis while doing work outside-at that time she thought it was related to the heat, patient is also had similar smaller episode in the interim.,  Patient evaluated emergency room, in no apparent distress, daughter at the bedside, patient now be admitted for acute chest pain.  PAST MEDICAL HISTORY:   Past Medical History:  Diagnosis Date  . Allergy    seasonal  . Family history of cancer   . Hearing loss    in L ear  . History of migraines    MRI -head,secondary H?A L_OCCIP within normal limits 07/1999//CT of head without Normal ([facility]) 12/21/2007  . Hyperlipidemia   . NSVD (normal spontaneous vaginal delivery)    x 2  . Osteoporosis     PAST SURGICAL HISTORY:  Past Surgical History:  Procedure Laterality Date  . ABDOMINAL HYSTERECTOMY  11/99   partial hysterectomy ovaries intact (fibroids  . normal vaginal delivery x2 [facility]    . stress cardiolite  04/28/2001   stress cardiolite wnl, EF 69%  . TUBAL LIGATION  1969    SOCIAL HISTORY:  Social History   Tobacco Use  . Smoking status: Former Smoker    Packs/day: 2.00    Years: 22.00    Pack years: 44.00    Last attempt to quit: 09/23/1981    Years since quitting: 36.0  . Smokeless tobacco: Never Used  .  Tobacco comment: quit in 1975  Substance Use Topics  . Alcohol use: No    FAMILY HISTORY:  Family History  Problem Relation Age of Onset  . Heart disease Mother 76       CHF  . Cancer Mother 13       lung cancer since her 61's with kidney cancer //cancer of the throat and tongue //never smoked or drank  . Diabetes Mother        borderline  . Cancer Father 43       melanoma  . Cancer Sister        breast cancer and ovarian  . Breast cancer Sister   . Ovarian cancer Sister   . Bone cancer Sister   . Heart disease Paternal Grandfather        MI  . Diabetes Brother   . Cancer Brother        lung cancer  . Stroke Maternal Grandmother   . Depression Maternal Grandfather   . Colon cancer Neg Hx     DRUG ALLERGIES:  Allergies  Allergen Reactions  . Crestor [Rosuvastatin Calcium]     indigestion  . Fosamax [Alendronate Sodium] Other (See Comments)    GI intolerance, dysphagia  . Oxycodone-Acetaminophen     REACTION: Rash on hands  . Prednisone Other (See Comments)    Chest pain and elevated BP  . Sumatriptan  REACTION: Tightness in chest    REVIEW OF SYSTEMS:   CONSTITUTIONAL: No fever, +fatigue/ weakness.  EYES: No blurred or double vision.  EARS, NOSE, AND THROAT: No tinnitus or ear pain.  RESPIRATORY: No cough, +shortness of breath, no wheezing or hemoptysis.  CARDIOVASCULAR: + chest pain, no orthopnea, edema.  GASTROINTESTINAL: No nausea, vomiting, +diarrhea no abdominal pain.  Problems swallowing GENITOURINARY: No dysuria, hematuria.  ENDOCRINE: No polyuria, nocturia,  HEMATOLOGY: No anemia, easy bruising or bleeding SKIN: No rash or lesion. MUSCULOSKELETAL: No joint pain or arthritis.   NEUROLOGIC: No tingling, numbness, weakness.  PSYCHIATRY: No anxiety or depression.   MEDICATIONS AT HOME:  Prior to Admission medications   Medication Sig Start Date End Date Taking? Authorizing Provider  cetirizine (ZYRTEC) 10 MG tablet Take 10 mg by mouth daily.      Yes [provider]  Cholecalciferol ([employer]S VITAMIN D3) 1000 UNITS capsule Take 2 capsules (2,000 Units total) by mouth daily. 12/09/14  Yes Joaquim Namuncan, Graham S, MD  fluticasone [facility](FLONASE) 50 MCG/ACT nasal spray Place 2 sprays into both nostrils as needed.    Yes [provider]  ibuprofen (ADVIL,MOTRIN) 200 MG tablet Take 200 mg by mouth every 6 (six) hours as needed.   Yes [provider]  ondansetron (ZOFRAN) 4 MG tablet Take 1 tablet (4 mg total) by mouth every 8 (eight) hours as needed for nausea or vomiting. 11/04/16  Yes Joaquim Namuncan, Graham S, MD  pravastatin (PRAVACHOL) 40 MG tablet take 1 tablet by mouth once daily 09/29/17  Yes Joaquim Namuncan, Graham S, MD  rizatriptan (MAXALT-MLT) 10 MG disintegrating tablet Take 1 tablet (10 mg total) by mouth as needed. May repeat in 2 hours if needed 06/24/16  Yes Joaquim Namuncan, Graham S, MD      PHYSICAL EXAMINATION:   VITAL SIGNS: Blood pressure (!) 161/77, pulse 62, temperature 97.6 F (36.4 C), temperature source Oral, resp. rate 18, height 5\' 5"  (1.651 m), weight 85.7 kg (189 lb), SpO2 100 %.  GENERAL:  72 y.o.-year-old patient lying in the bed with no acute distress.  EYES: Pupils equal, round, reactive to light and accommodation. No scleral icterus. Extraocular muscles intact.  HEENT: Head atraumatic, normocephalic. Oropharynx and nasopharynx clear.  NECK:  Supple, no jugular venous distention. No thyroid enlargement, no tenderness.  LUNGS: Normal breath sounds bilaterally, no wheezing, rales,rhonchi or crepitation. No use of accessory muscles of respiration.  CARDIOVASCULAR: S1, S2 normal. No murmurs, rubs, or gallops.  ABDOMEN: Soft, nontender, nondistended. Bowel sounds present. No organomegaly or mass.  EXTREMITIES: No pedal edema, cyanosis, or clubbing.  NEUROLOGIC: Cranial nerves II through XII are intact. MAES. Gait not checked.  PSYCHIATRIC: The patient is alert and oriented x 3.  SKIN: No obvious rash, lesion, or ulcer.    LABORATORY PANEL:   CBC Recent Labs  Lab 10/18/17 1155  WBC 5.1  HGB 15.0  HCT 45.0  PLT 205  MCV 83.4  MCH 27.9  MCHC 33.4  RDW 13.6   ------------------------------------------------------------------------------------------------------------------  Chemistries  Recent Labs  Lab 10/18/17 1155  NA 140  K 4.0  CL 106  CO2 27  GLUCOSE 111*  BUN 16  CREATININE 1.14*  CALCIUM 9.3   ------------------------------------------------------------------------------------------------------------------ estimated creatinine clearance is 48.9 mL/min (A) (by C-G formula based on SCr of 1.14 mg/dL (H)). ------------------------------------------------------------------------------------------------------------------ No results for input(s): TSH, T4TOTAL, T3FREE, THYROIDAB in the last 72 hours.  Invalid input(s): FREET3   Coagulation profile No results for input(s): INR, PROTIME in the last 168 hours. ------------------------------------------------------------------------------------------------------------------- No  results for input(s): DDIMER in the last 72 hours. -------------------------------------------------------------------------------------------------------------------  Cardiac Enzymes Recent Labs  Lab 10/18/17 1155  TROPONINI <0.03   ------------------------------------------------------------------------------------------------------------------ Invalid input(s): POCBNP  ---------------------------------------------------------------------------------------------------------------  Urinalysis    Component Value Date/Time   COLORURINE yellow 03/30/2007 0842   APPEARANCEUR Hazy 03/30/2007 0842   LABSPEC 1.010 03/30/2007 0842   PHURINE 7.0 03/30/2007 0842   HGBUR small 03/30/2007 0842   BILIRUBINUR negative 03/30/2007 0842   UROBILINOGEN 1.0 03/30/2007 0842   NITRITE negative 03/30/2007 0842     RADIOLOGY: Dg Chest 2 View  Result Date:  10/18/2017 CLINICAL DATA:  Chest pain and reflux symptoms EXAM: CHEST  2 VIEW COMPARISON:  12/21/2007 FINDINGS: Cardiac shadow is stable. Mild aortic calcifications are again seen. The lungs are well aerated bilaterally. Mild interstitial changes are noted without focal infiltrate. No sizable effusion is seen. Degenerative change of the thoracic spine is noted. IMPRESSION: Mild interstitial changes which may represent some bronchitic change. No focal infiltrate is noted. Electronically Signed   By: Alcide Clever M.D.   On: 10/18/2017 13:54    EKG: Orders placed or performed during the hospital encounter of 10/18/17  . EKG 12-Lead  . EKG 12-Lead  . ED EKG within 10 minutes  . ED EKG within 10 minutes    IMPRESSION AND PLAN: 1 acute recurrent chest pain Lasting 30-50 minutes, radiating into the jaw, dyspnea on exertion, nausea, diaphoresis, third episode since July 2018 Referred to the observation unit, consult cardiology for expert opinion, rule out acute coronary syndrome with cardiac enzymes x3 sets, if negative-proceed with nuclear medicine stress testing in the morning, echocardiogram, beta-blocker therapy, statin therapy-check lipids in the morning, morphine IV for breakthrough pain, nitrates as needed, supplemental oxygen, aspirin daily, and continue close medical monitoring  2 chronic GERD without esophagitis PPI daily  3 acute diarrhea GI panel and contact precautions while in house  4 chronic dysphasia Speech therapy to evaluate/treat  5 chronic hyperlipidemia, unspecified Continue statin therapy and check lipids in the morning   All the records are reviewed and case discussed with ED provider. Management plans discussed with the patient, family and they are in agreement.  CODE STATUS: Code Status History    This patient does not have a recorded code status. Please follow your organizational policy for patients in this situation.       TOTAL TIME TAKING CARE OF THIS  PATIENT: 45 minutes.    Evelena Asa Salary M.D on 10/18/2017   Between 7am to 6pm - Pager - [phone]  After 6pm go to [email or web address] - password [email or web address] [facility]  [facility]  Office  [phone]  CC: Primary care physician; Joaquim Nam, MD   Note: This dictation was prepared with Dragon dictation along with smaller phrase technology. Any transcriptional errors that result from this process are unintentional.

## 2017-10-18 NOTE — ED Notes (Signed)
Pt transported to XR.  

## 2017-10-19 ENCOUNTER — Observation Stay
Admit: 2017-10-19 | Discharge: 2017-10-19 | Disposition: A | Discharge status: Home / Self Care | Payer: Medicare Other | Attending: Family Medicine | Admitting: Family Medicine

## 2017-10-19 DIAGNOSIS — K219 Gastro-esophageal reflux disease without esophagitis: Secondary | ICD-10-CM | POA: Diagnosis not present

## 2017-10-19 DIAGNOSIS — R079 Chest pain, unspecified: Secondary | ICD-10-CM | POA: Diagnosis not present

## 2017-10-19 DIAGNOSIS — M1990 Unspecified osteoarthritis, unspecified site: Secondary | ICD-10-CM | POA: Diagnosis not present

## 2017-10-19 DIAGNOSIS — E785 Hyperlipidemia, unspecified: Secondary | ICD-10-CM | POA: Diagnosis not present

## 2017-10-19 DIAGNOSIS — R1310 Dysphagia, unspecified: Secondary | ICD-10-CM | POA: Diagnosis not present

## 2017-10-19 DIAGNOSIS — E782 Mixed hyperlipidemia: Secondary | ICD-10-CM | POA: Diagnosis not present

## 2017-10-19 DIAGNOSIS — I200 Unstable angina: Secondary | ICD-10-CM | POA: Diagnosis not present

## 2017-10-19 LAB — TROPONIN I: Troponin I: <0.03 ng/mL (ref <0.03)

## 2017-10-19 LAB — LIPID PANEL
Cholesterol: 181 mg/dL (ref 0–200)
HDL: 43 mg/dL (ref >40)
LDL CALC: 118 mg/dL — AB (ref 0–99)
TRIGLYCERIDES: 101 mg/dL (ref <150)
Total CHOL/HDL Ratio: 4.2 RATIO
VLDL: 20 mg/dL (ref 0–40)

## 2017-10-19 MED ORDER — SODIUM CHLORIDE 0.9% FLUSH
3.0000 mL | Freq: Two times a day (BID) | INTRAVENOUS | Status: DC
  Administered 2017-10-19 – 2017-10-20 (×2): 3 mL via INTRAVENOUS

## 2017-10-19 MED ORDER — PANTOPRAZOLE SODIUM 40 MG PO TBEC
40.0000 mg | DELAYED_RELEASE_TABLET | Freq: Every day | ORAL | Status: DC
  Administered 2017-10-20: 40 mg via ORAL
  Filled 2017-10-19 (×2): qty 1

## 2017-10-19 MED ORDER — ASPIRIN 81 MG PO CHEW
81.0000 mg | CHEWABLE_TABLET | Freq: Every day | ORAL | Status: DC
  Administered 2017-10-20: 81 mg via ORAL
  Filled 2017-10-19 (×2): qty 1

## 2017-10-19 NOTE — Consult Note (Addendum)
Cardiology Consultation:   Patient ID: Rebecca Moran; [id number]; Dec 23, 1945   Admit date: 10/18/2017 Date of Consult: 10/19/2017  Primary Care Provider: Joaquim Namuncan, Graham S, MD Primary Cardiologist:  New  Primary Electrophysiologist:     Patient Profile:   Rebecca Moran is a 72 y.o. female with a hx of hyperlipidemia who is being seen today for the evaluation of chest discomfort at the request of  Dr. Nemiah CommanderKalisetti .  History of Present Illness:   Rebecca Moran is a 72 year old female with a history of hyperlipidemia.  She presents with an episode of chest discomfort/chest pressure.  She described it as a stack of bricks lying on her chest.  The episode lasted for about 30-50 minutes. She also describes some quivering in her chest/palpitations. The episode was associated with some nausea, diaphoresis and her daughter said that she was pale.  She has noted increasing shortness of breath with exertion for the past year or so.  She used to be quite active.  She helps her husband out doing fairly strenuous yard work but recently has had trouble doing her normal activities.  She has a history of dysphasia and notes that food will occasionally get stuck in her esophagus.    She denies any fevers or chills.  She denies any recent cough.  She denies any weight gain or weight loss.  She denies any heat or cold intolerance.  She denies any nausea vomiting or diarrhea.  Past Medical History:  Diagnosis Date  . Allergy    seasonal  . Family history of cancer   . Hearing loss    in L ear  . History of migraines    MRI -head,secondary H?A L_OCCIP within normal limits 07/1999//CT of head without Normal ([facility]) 12/21/2007  . Hyperlipidemia   . NSVD (normal spontaneous vaginal delivery)    x 2  . Osteoporosis     Past Surgical History:  Procedure Laterality Date  . ABDOMINAL HYSTERECTOMY  11/99   partial hysterectomy ovaries intact (fibroids  . normal vaginal delivery x2 [facility]    . stress cardiolite  04/28/2001   stress cardiolite wnl, EF 69%  . TUBAL LIGATION  1969     Home Medications:  Prior to Admission medications   Medication Sig Start Date End Date Taking? Authorizing Provider  cetirizine (ZYRTEC) 10 MG tablet Take 10 mg by mouth daily.     Yes [provider]  Cholecalciferol ([employer] VITAMIN D3) 1000 UNITS capsule Take 2 capsules (2,000 Units total) by mouth daily. 12/09/14  Yes Joaquim Namuncan, Graham S, MD  fluticasone [facility](FLONASE) 50 MCG/ACT nasal spray Place 2 sprays into both nostrils as needed.    Yes [provider]  ibuprofen (ADVIL,MOTRIN) 200 MG tablet Take 200 mg by mouth every 6 (six) hours as needed.   Yes [provider]  ondansetron (ZOFRAN) 4 MG tablet Take 1 tablet (4 mg total) by mouth every 8 (eight) hours as needed for nausea or vomiting. 11/04/16  Yes Joaquim Namuncan, Graham S, MD  pravastatin (PRAVACHOL) 40 MG tablet take 1 tablet by mouth once daily 09/29/17  Yes Joaquim Namuncan, Graham S, MD  rizatriptan (MAXALT-MLT) 10 MG disintegrating tablet Take 1 tablet (10 mg total) by mouth as needed. May repeat in 2 hours if needed 06/24/16  Yes Joaquim Namuncan, Graham S, MD    Inpatient Medications: Scheduled Meds: . aspirin  325 mg Oral Daily  . cholecalciferol  2,000 Units Oral Daily  . enoxaparin (LOVENOX) injection  40 mg Subcutaneous  Q24H  . fluticasone  2 spray Each Nare Daily  . loratadine  10 mg Oral Daily  . metoprolol tartrate  12.5 mg Oral BID  . pantoprazole (PROTONIX) IV  40 mg Intravenous Q12H  . pravastatin  40 mg Oral Daily   Continuous Infusions:  PRN Meds: acetaminophen, ALPRAZolam, gi cocktail, morphine injection, nitroGLYCERIN, ondansetron (ZOFRAN) IV, rizatriptan  Allergies:    Allergies  Allergen Reactions  . Crestor [Rosuvastatin Calcium]     indigestion  . Fosamax [Alendronate Sodium] Other (See Comments)    GI intolerance, dysphagia  . Oxycodone-Acetaminophen     REACTION: Rash on hands  . Prednisone  Other (See Comments)    Chest pain and elevated BP  . Sumatriptan     REACTION: Tightness in chest    Social History:   Social History   Socioeconomic History  . Marital status: Married    Spouse name: Not on file  . Number of children: 2  . Years of education: Not on file  . Highest education level: Not on file  Social Needs  . Financial resource strain: Not on file  . Food insecurity - worry: Not on file  . Food insecurity - inability: Not on file  . Transportation needs - medical: Not on file  . Transportation needs - non-medical: Not on file  Occupational History  . 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 Smoking status: Former Smoker    Packs/day: 2.00    Years: 22.00    Pack years: 44.00    Last attempt to quit: 09/23/1981    Years since quitting: 36.0  . Smokeless tobacco: Never Used  . Tobacco comment: quit in 1975  Substance and Sexual Activity  . Alcohol use: No  . Drug use: No  . Sexual activity: No  Other Topics Concern  . Not on file  Social History Narrative   Married- 1967   Two stepchildren and two children of her own.   retired    Family History:    Family History  Problem Relation Age of Onset  . Heart disease Mother 61       CHF  . Cancer Mother 74       lung cancer since her 81's with kidney cancer //cancer of the throat and tongue //never smoked or drank  . Diabetes Mother        borderline  . Cancer Father 38       melanoma  . Cancer Sister        breast cancer and ovarian  . Breast cancer Sister   . Ovarian cancer Sister   . Bone cancer Sister   . Heart disease Paternal Grandfather        MI  . Diabetes Brother   . Cancer Brother        lung cancer  . Stroke Maternal Grandmother   . Depression Maternal Grandfather   . Colon cancer Neg Hx      ROS:  Please see the history of present illness.   All other ROS reviewed and negative.     Physical Exam/Data:    Vitals:   10/18/17 1905 10/18/17 2013 10/19/17 0449 10/19/17 0800  BP: (!) 161/69 111/62 138/72 (!) 146/85  Pulse: 61 67 62 66  Resp: 18 17 18    Temp: 97.7 F (36.5 C) 97.8 F (36.6 C) 97.9 F (36.6 C) 97.6 F (36.4 C)  TempSrc:  Oral Oral Oral Oral  SpO2: 100% 98% 97% 100%  Weight:      Height:        Intake/Output Summary (Last 24 hours) at 10/19/2017 1051 Last data filed at 10/19/2017 1000 Gross per 24 hour  Intake -  Output 100 ml  Net -100 ml   Filed Weights   10/18/17 1154  Weight: 189 lb (85.7 kg)   Body mass index is 31.45 kg/m.  General:  Well nourished, well developed, in no acute distress HEENT: normal Lymph: no adenopathy Neck: no JVD Endocrine:  No thryomegaly Vascular: No carotid bruits; FA pulses 2+ bilaterally without bruits  Cardiac:  normal S1, S2; RRR; no murmur   Lungs:  clear to auscultation bilaterally, no wheezing, rhonchi or rales  Abd: soft, nontender, no hepatomegaly  Ext: no edema Musculoskeletal:  No deformities, BUE and BLE strength normal and equal Skin: warm and dry  Neuro:  CNs 2-12 intact, no focal abnormalities noted Psych:  Normal affect   EKG:  The EKG was personally reviewed and demonstrates:    NSR ,   RBBB , no acute ST or T wave changes      Relevant CV Studies:   Laboratory Data:  Chemistry Recent Labs  Lab 10/18/17 1155  NA 140  K 4.0  CL 106  CO2 27  GLUCOSE 111*  BUN 16  CREATININE 1.14*  CALCIUM 9.3  GFRNONAA 47*  GFRAA 55*  ANIONGAP 7    No results for input(s): PROT, ALBUMIN, AST, ALT, ALKPHOS, BILITOT in the last 168 hours. Hematology Recent Labs  Lab 10/18/17 1155  WBC 5.1  RBC 5.40*  HGB 15.0  HCT 45.0  MCV 83.4  MCH 27.9  MCHC 33.4  RDW 13.6  PLT 205   Cardiac Enzymes Recent Labs  Lab 10/18/17 1155 10/18/17 1949 10/18/17 2217 10/19/17 0150  TROPONINI <0.03 <0.03 <0.03 <0.03   No results for input(s): TROPIPOC in the last 168 hours.  BNPNo results for input(s): BNP, PROBNP  in the last 168 hours.  DDimer No results for input(s): DDIMER in the last 168 hours.  Radiology/Studies:  Dg Chest 2 View  Result Date: 10/18/2017 CLINICAL DATA:  Chest pain and reflux symptoms EXAM: CHEST  2 VIEW COMPARISON:  12/21/2007 FINDINGS: Cardiac shadow is stable. Mild aortic calcifications are again seen. The lungs are well aerated bilaterally. Mild interstitial changes are noted without focal infiltrate. No sizable effusion is seen. Degenerative change of the thoracic spine is noted. IMPRESSION: Mild interstitial changes which may represent some bronchitic change. No focal infiltrate is noted. Electronically Signed   By: Alcide Clever M.D.   On: 10/18/2017 13:54    Assessment and Plan:   1. Chest discomfort: Patient presents with some concerning symptoms of chest pain and tightness.  She describes the pain as feeling like there was a stack of bricks on her chest.  At the discomfort resolved spontaneously and has not recurred.  She has had some shortness of breath with exertion but has not had any episodes of chest pain with exertion.  Echo  has been ordered and is pending.  Fortunately, her EKG is unremarkable.  Her troponin levels are negative. She has already eaten breakfast this morning so we will anticipate doing a stress test on Monday morning.  Have her monitor to not eat breakfast tomorrow morning.  We discussed the risks, benefits, and options of heart catheterization in case her Myoview is abnormal and she needs to go to heart catheterization.  2.  Hyperlipidemia: Current lipid levels reveal a total cholesterol of 181, HDL is 43, LDL is 118, triglycerides are 101. Continue Pravachol for now.   For questions or updates, please contact [facility] Please consult [email or web address] for contact info under Cardiology/STEMI.   Signed, Kristeen Miss, MD  10/19/2017 10:51 AM

## 2017-10-19 NOTE — Progress Notes (Signed)
I have reviewed the assessment done by the student and agree with her findings.

## 2017-10-19 NOTE — Progress Notes (Signed)
[facility] [place] [facility]   PATIENT NAME: Rebecca Hawthornehyllis Ludlam    MR#:  [id number]  DATE OF BIRTH:  1945-10-21  SUBJECTIVE:  CHIEF COMPLAINT:   Chief Complaint  Patient presents with  . Chest Pain   -Patient admitted with chest pain and ongoing dyspnea for almost a year now. -For stress test tomorrow.  REVIEW OF SYSTEMS:  Review of Systems  Constitutional: Negative for chills and fever.  HENT: Negative for congestion, ear discharge, hearing loss and nosebleeds.   Eyes: Negative for blurred vision and double vision.  Respiratory: Positive for shortness of breath. Negative for cough and wheezing.   Cardiovascular: Positive for chest pain. Negative for palpitations and leg swelling.  Gastrointestinal: Positive for heartburn and nausea. Negative for abdominal pain, constipation, diarrhea and vomiting.  Genitourinary: Negative for dysuria.  Musculoskeletal: Negative for myalgias.  Neurological: Positive for weakness. Negative for dizziness, speech change, focal weakness, seizures and headaches.  Psychiatric/Behavioral: Negative for depression.    DRUG ALLERGIES:   Allergies  Allergen Reactions  . Crestor [Rosuvastatin Calcium]     indigestion  . Fosamax [Alendronate Sodium] Other (See Comments)    GI intolerance, dysphagia  . Oxycodone-Acetaminophen     REACTION: Rash on hands  . Prednisone Other (See Comments)    Chest pain and elevated BP  . Sumatriptan     REACTION: Tightness in chest    VITALS:  Blood pressure (!) 146/85, pulse 66, temperature 97.6 F (36.4 C), temperature source Oral, resp. rate 18, height 5\' 5"  (1.651 m), weight 85.7 kg (189 lb), SpO2 100 %.  PHYSICAL EXAMINATION:  Physical Exam  GENERAL:  72 y.o.-year-old patient lying in the bed with no acute distress.  EYES: Pupils equal, round, reactive to light and accommodation. No scleral icterus. Extraocular muscles intact.  HEENT: Head atraumatic, normocephalic. Oropharynx  and nasopharynx clear.  NECK:  Supple, no jugular venous distention. No thyroid enlargement, no tenderness.  LUNGS: Normal breath sounds bilaterally, no wheezing, rales,rhonchi or crepitation. No use of accessory muscles of respiration.  CARDIOVASCULAR: S1, S2 normal. No murmurs, rubs, or gallops.  ABDOMEN: Soft, nontender, nondistended. Bowel sounds present. No organomegaly or mass.  EXTREMITIES: No pedal edema, cyanosis, or clubbing.  NEUROLOGIC: Cranial nerves II through XII are intact. Muscle strength 5/5 in all extremities. Sensation intact. Gait not checked.  PSYCHIATRIC: The patient is alert and oriented x 3.  SKIN: No obvious rash, lesion, or ulcer.    LABORATORY PANEL:   CBC Recent Labs  Lab 10/18/17 1155  WBC 5.1  HGB 15.0  HCT 45.0  PLT 205   ------------------------------------------------------------------------------------------------------------------  Chemistries  Recent Labs  Lab 10/18/17 1155  NA 140  K 4.0  CL 106  CO2 27  GLUCOSE 111*  BUN 16  CREATININE 1.14*  CALCIUM 9.3   ------------------------------------------------------------------------------------------------------------------  Cardiac Enzymes Recent Labs  Lab 10/19/17 0150  TROPONINI <0.03   ------------------------------------------------------------------------------------------------------------------  RADIOLOGY:  Dg Chest 2 View  Result Date: 10/18/2017 CLINICAL DATA:  Chest pain and reflux symptoms EXAM: CHEST  2 VIEW COMPARISON:  12/21/2007 FINDINGS: Cardiac shadow is stable. Mild aortic calcifications are again seen. The lungs are well aerated bilaterally. Mild interstitial changes are noted without focal infiltrate. No sizable effusion is seen. Degenerative change of the thoracic spine is noted. IMPRESSION: Mild interstitial changes which may represent some bronchitic change. No focal infiltrate is noted. Electronically Signed   By: Alcide CleverMark  Lukens M.D.   On: 10/18/2017 13:54     EKG:  Orders placed or performed during the hospital encounter of 10/18/17  . EKG 12-Lead  . EKG 12-Lead  . ED EKG within 10 minutes  . ED EKG within 10 minutes  . EKG 12-Lead (at 6am)  . EKG 12-Lead (at 6am)    ASSESSMENT AND PLAN:   72 year old female with no significant past medical history presents to hospital secondary to worsening chest pain.  1. Chest pain-GERD versus unstable angina. -Has been having dyspnea on exertion and fatigue for almost a year now. Gradual decline in exercise capacity noted. -Third episode of chest pain associated with heaviness and radiation to the jaw and left arm. -Troponins negative 3. Agree with echocardiogram -Cardiology evaluation and stress test in a.m. -Until then continue low-dose aspirin and metoprolol -If stress test is negative, will need GI workup as outpatient  2. GERD symptoms-no known diagnosis of GERD, -Continue Protonix -changed to oral twice a day -Maalox when necessary. Also concern for esophageal spasms versus stricture. -EGD as outpatient once cardiac workup is negative  3. Hyperlipidemia-on statin  4. DVT prophylaxis-Lovenox  Patient is independent and ambulatory   All the records are reviewed and case discussed with Care Management/Social Workerr. Management plans discussed with the patient, family and they are in agreement.  CODE STATUS: Full code  TOTAL TIME TAKING CARE OF THIS PATIENT: 33 minutes.   POSSIBLE D/C IN 1-2 DAYS, DEPENDING ON CLINICAL CONDITION.   Enid Baas M.D on 10/19/2017 at 11:14 AM  Between 7am to 6pm - Pager - [phone]  After 6pm go to [email or web address] - Social research officer, government  [facility]  Office  [phone]  CC: Primary care physician; Joaquim Nam, MD

## 2017-10-19 NOTE — Progress Notes (Signed)
Pt to ECHO at this time.

## 2017-10-20 ENCOUNTER — Observation Stay (HOSPITAL_BASED_OUTPATIENT_CLINIC_OR_DEPARTMENT_OTHER): Payer: Medicare Other

## 2017-10-20 DIAGNOSIS — I361 Nonrheumatic tricuspid (valve) insufficiency: Secondary | ICD-10-CM | POA: Diagnosis not present

## 2017-10-20 DIAGNOSIS — M1990 Unspecified osteoarthritis, unspecified site: Secondary | ICD-10-CM | POA: Diagnosis not present

## 2017-10-20 DIAGNOSIS — R079 Chest pain, unspecified: Secondary | ICD-10-CM | POA: Diagnosis not present

## 2017-10-20 DIAGNOSIS — I200 Unstable angina: Secondary | ICD-10-CM | POA: Diagnosis not present

## 2017-10-20 DIAGNOSIS — K219 Gastro-esophageal reflux disease without esophagitis: Secondary | ICD-10-CM | POA: Diagnosis not present

## 2017-10-20 DIAGNOSIS — R1310 Dysphagia, unspecified: Secondary | ICD-10-CM | POA: Diagnosis not present

## 2017-10-20 DIAGNOSIS — E785 Hyperlipidemia, unspecified: Secondary | ICD-10-CM | POA: Diagnosis not present

## 2017-10-20 LAB — NM MYOCAR MULTI W/SPECT W/WALL MOTION / EF
CHL CUP MPHR: 149 {beats}/min
CHL CUP NUCLEAR SDS: 2
CHL CUP NUCLEAR SRS: 1
CHL CUP NUCLEAR SSS: 0
CSEPED: 0 min
CSEPPHR: 81 {beats}/min
Estimated workload: 1 METS
Exercise duration (sec): 0 s
LVDIAVOL: 28 mL (ref 46–106)
LVSYSVOL: 11 mL
Percent HR: 54 %
Rest HR: 60 {beats}/min
TID: 0.98

## 2017-10-20 LAB — COMPREHENSIVE METABOLIC PANEL
ALBUMIN: 3.7 g/dL (ref 3.5–5.0)
ALT: 15 U/L (ref 14–54)
ANION GAP: 7 (ref 5–15)
AST: 26 U/L (ref 15–41)
Alkaline Phosphatase: 65 U/L (ref 38–126)
BUN: 18 mg/dL (ref 6–20)
CHLORIDE: 107 mmol/L (ref 101–111)
CO2: 26 mmol/L (ref 22–32)
Calcium: 8.7 mg/dL — ABNORMAL LOW (ref 8.9–10.3)
Creatinine, Ser: 1.08 mg/dL — ABNORMAL HIGH (ref 0.44–1.00)
GFR calc Af Amer: 58 mL/min — ABNORMAL LOW (ref >60)
GFR, EST NON AFRICAN AMERICAN: 50 mL/min — AB (ref >60)
Glucose, Bld: 100 mg/dL — ABNORMAL HIGH (ref 65–99)
Potassium: 4 mmol/L (ref 3.5–5.1)
Sodium: 140 mmol/L (ref 135–145)
Total Bilirubin: 1 mg/dL (ref 0.3–1.2)
Total Protein: 6.3 g/dL — ABNORMAL LOW (ref 6.5–8.1)

## 2017-10-20 LAB — ECHOCARDIOGRAM COMPLETE
Height: 65 in
Weight: 3024 oz

## 2017-10-20 MED ORDER — TECHNETIUM TC 99M TETROFOSMIN IV KIT
13.0000 | PACK | Freq: Once | INTRAVENOUS | Status: AC | PRN
  Administered 2017-10-20: 13.57 via INTRAVENOUS

## 2017-10-20 MED ORDER — REGADENOSON 0.4 MG/5ML IV SOLN
0.4000 mg | Freq: Once | INTRAVENOUS | Status: AC
  Administered 2017-10-20: 0.4 mg via INTRAVENOUS

## 2017-10-20 MED ORDER — TECHNETIUM TC 99M TETROFOSMIN IV KIT
30.0200 | PACK | Freq: Once | INTRAVENOUS | Status: AC | PRN
  Administered 2017-10-20: 30.02 via INTRAVENOUS

## 2017-10-20 MED ORDER — PANTOPRAZOLE SODIUM 40 MG PO TBEC: 40.0000 mg | DELAYED_RELEASE_TABLET | Freq: Every day | ORAL | 2 refills | Status: DC

## 2017-10-20 NOTE — Progress Notes (Signed)
Progress Note  Patient Name: Rebecca Moran Date of Encounter: 10/20/2017  Primary Cardiologist: Lorine BearsMuhammad Arida, MD - new  Subjective   No chest pain or sob overnight.  For MV this AM.  Inpatient Medications    Scheduled Meds: . aspirin  81 mg Oral Daily  . cholecalciferol  2,000 Units Oral Daily  . enoxaparin (LOVENOX) injection  40 mg Subcutaneous Q24H  . fluticasone  2 spray Each Nare Daily  . loratadine  10 mg Oral Daily  . pantoprazole  40 mg Oral Daily  . pravastatin  40 mg Oral Daily  . sodium chloride flush  3 mL Intravenous Q12H   Continuous Infusions:  PRN Meds: acetaminophen, ALPRAZolam, gi cocktail, nitroGLYCERIN, ondansetron (ZOFRAN) IV, rizatriptan   Vital Signs    Vitals:   10/19/17 0449 10/19/17 0800 10/19/17 1943 10/20/17 0434  BP: 138/72 (!) 146/85 138/66 122/86  Pulse: 62 66 65 (!) 57  Resp: 18  18 16   Temp: 97.9 F (36.6 C) 97.6 F (36.4 C) 98 F (36.7 C) 97.8 F (36.6 C)  TempSrc: Oral Oral Oral Oral  SpO2: 97% 100% 99% 97%  Weight:      Height:        Intake/Output Summary (Last 24 hours) at 10/20/2017 0848 Last data filed at 10/20/2017 0433 Gross per 24 hour  Intake 483 ml  Output 1050 ml  Net -567 ml   Filed Weights   10/18/17 1154  Weight: 189 lb (85.7 kg)    Physical Exam   GEN: Well nourished, well developed, in no acute distress.  HEENT: Grossly normal.  Neck: Supple, no JVD, carotid bruits, or masses. Cardiac: RRR, no murmurs, rubs, or gallops. No clubbing, cyanosis, edema.  Radials/DP/PT 2+ and equal bilaterally.  Respiratory:  Respirations regular and unlabored, clear to auscultation bilaterally. GI: Soft, nontender, nondistended, BS + x 4. MS: no deformity or atrophy. Skin: warm and dry, no rash. Neuro:  Strength and sensation are intact. Psych: AAOx3.  Normal affect.  Labs    Chemistry Recent Labs  Lab 10/18/17 1155 10/20/17 0413  NA 140 140  K 4.0 4.0  CL 106 107  CO2 27 26  GLUCOSE 111* 100*    BUN 16 18  CREATININE 1.14* 1.08*  CALCIUM 9.3 8.7*  PROT  --  6.3*  ALBUMIN  --  3.7  AST  --  26  ALT  --  15  ALKPHOS  --  65  BILITOT  --  1.0  GFRNONAA 47* 50*  GFRAA 55* 58*  ANIONGAP 7 7     Hematology Recent Labs  Lab 10/18/17 1155  WBC 5.1  RBC 5.40*  HGB 15.0  HCT 45.0  MCV 83.4  MCH 27.9  MCHC 33.4  RDW 13.6  PLT 205    Cardiac Enzymes Recent Labs  Lab 10/18/17 1155 10/18/17 1949 10/18/17 2217 10/19/17 0150  TROPONINI <0.03 <0.03 <0.03 <0.03      Radiology    Dg Chest 2 View  Result Date: 10/18/2017 CLINICAL DATA:  Chest pain and reflux symptoms EXAM: CHEST  2 VIEW COMPARISON:  12/21/2007 FINDINGS: Cardiac shadow is stable. Mild aortic calcifications are again seen. The lungs are well aerated bilaterally. Mild interstitial changes are noted without focal infiltrate. No sizable effusion is seen. Degenerative change of the thoracic spine is noted. IMPRESSION: Mild interstitial changes which may represent some bronchitic change. No focal infiltrate is noted. Electronically Signed   By: Alcide CleverMark  Lukens M.D.   On: 10/18/2017 13:54  Telemetry    RSR/Sinus brady - Personally Reviewed  Cardiac Studies   2D Echocardiogram 1.27.2019  Study Conclusions  - Left ventricle: The cavity size was normal. Wall thickness was   normal. Systolic function was normal. The estimated ejection   fraction was in the range of 55% to 60%. Wall motion was normal;   there were no regional wall motion abnormalities. Doppler   parameters are consistent with abnormal left ventricular   relaxation (grade 1 diastolic dysfunction). - Left atrium: The atrium was mildly dilated. - Pulmonary arteries: Systolic pressure was within the normal   range.  Patient Profile     72 y.o. female w/ a h/o HL, migraines, and seasonal allergies, who was admitted 1/26 with chest heaviness, dyspnea, nausea, and palpitations.  Assessment & Plan    1.  [place]:  Admitted 1/26 with chest  heaviness, dyspnea, nausea, and palps.  Trop nl. No events on tele.  No recurrent Ss.  Echo showed nl EF w/ Gr2 DD.  No significant valvular dzs.  For Myoview today.  2.  HL:  LDL 118 on statin therapy.  Signed, Nicolasa Ducking, NP  10/20/2017, 8:48 AM    For questions or updates, please contact   Please consult [email or web address] for contact info under Cardiology/STEMI.

## 2017-10-20 NOTE — Discharge Summary (Signed)
[employer] -  at [facility]   PATIENT NAME: Rebecca Moran    MR#:  [id number]  DATE OF BIRTH:  09-22-1946  DATE OF ADMISSION:  10/18/2017   ADMITTING PHYSICIAN: Bertrum Sol, MD  DATE OF DISCHARGE: 10/20/17  PRIMARY CARE PHYSICIAN: Joaquim Nam, MD   ADMISSION DIAGNOSIS:   Chest pain with moderate risk for cardiac etiology [R07.9]  DISCHARGE DIAGNOSIS:   Active Problems:   Chest pain   SECONDARY DIAGNOSIS:   Past Medical History:  Diagnosis Date  . Allergy    seasonal  . Family history of cancer   . Hearing loss    in L ear  . History of migraines    MRI -head,secondary H?A L_OCCIP within normal limits 07/1999//CT of head without Normal ([facility]) 12/21/2007  . Hyperlipidemia   . NSVD (normal spontaneous vaginal delivery)    x 2  . Osteoporosis     HOSPITAL COURSE:   72 year old female with no significant past medical history presents to hospital secondary to worsening chest pain.  1. Chest pain- likely secondary GERD -Troponins negative 3. Echocardiogram is normal. No further chest pain. -Patient cardiology consult. Myoview done and it is negative for any ischemia - will need GI workup as outpatient  2. GERD symptoms-no known diagnosis of GERD, -Started on Protonix this admission-will discharge on daily PPI -Maalox when necessary. Also concern for esophageal spasms versus stricture. -GI follow up and EGD as outpatient likely  3. Hyperlipidemia-on statin    Patient is independent and ambulatory Stable for discharge today    DISCHARGE CONDITIONS:   Guarded  CONSULTS OBTAINED:   Treatment Team:  Nahser, Deloris Ping, MD  DRUG ALLERGIES:   Allergies  Allergen Reactions  . Crestor [Rosuvastatin Calcium]     indigestion  . Fosamax [Alendronate Sodium] Other (See Comments)    GI intolerance, dysphagia  . Oxycodone-Acetaminophen     REACTION: Rash on hands  . Prednisone Other (See Comments)    Chest pain  and elevated BP  . Sumatriptan     REACTION: Tightness in chest   DISCHARGE MEDICATIONS:   Allergies as of 10/20/2017      Reactions   Crestor [rosuvastatin Calcium]    indigestion   Fosamax [alendronate Sodium] Other (See Comments)   GI intolerance, dysphagia   Oxycodone-acetaminophen    REACTION: Rash on hands   Prednisone Other (See Comments)   Chest pain and elevated BP   Sumatriptan    REACTION: Tightness in chest      Medication List    STOP taking these medications   ibuprofen 200 MG tablet Commonly known as:  ADVIL,MOTRIN     TAKE these medications   cetirizine 10 MG tablet Commonly known as:  ZYRTEC Take 10 mg by mouth daily.   Cholecalciferol 1000 units capsule Commonly known as:  CVS VITAMIN D3 Take 2 capsules (2,000 Units total) by mouth daily.   fluticasone 50 MCG/ACT nasal spray Commonly known as:  FLONASE Place 2 sprays into both nostrils as needed.   ondansetron 4 MG tablet Commonly known as:  ZOFRAN Take 1 tablet (4 mg total) by mouth every 8 (eight) hours as needed for nausea or vomiting.   pantoprazole 40 MG tablet Commonly known as:  PROTONIX Take 1 tablet (40 mg total) by mouth daily. Start taking on:  10/21/2017   pravastatin 40 MG tablet Commonly known as:  PRAVACHOL take 1 tablet by mouth once daily   rizatriptan 10 MG disintegrating tablet Commonly  known as:  MAXALT-MLT Take 1 tablet (10 mg total) by mouth as needed. May repeat in 2 hours if needed        DISCHARGE INSTRUCTIONS:   1. PCP follow-up in once 2 weeks 2. GI follow up in 2 weeks  DIET:   Cardiac diet  ACTIVITY:   Activity as tolerated  OXYGEN:   Home Oxygen: No.  Oxygen Delivery: room air  DISCHARGE LOCATION:   home   If you experience worsening of your admission symptoms, develop shortness of breath, life threatening emergency, suicidal or homicidal thoughts you must seek medical attention immediately by calling 911 or calling your MD immediately  if  symptoms less severe.  You Must read complete instructions/literature along with all the possible adverse reactions/side effects for all the Medicines you take and that have been prescribed to you. Take any new Medicines after you have completely understood and accpet all the possible adverse reactions/side effects.   Please note  You were cared for by a hospitalist during your hospital stay. If you have any questions about your discharge medications or the care you received while you were in the hospital after you are discharged, you can call the unit and asked to speak with the hospitalist on call if the hospitalist that took care of you is not available. Once you are discharged, your primary care physician will handle any further medical issues. Please note that NO REFILLS for any discharge medications will be authorized once you are discharged, as it is imperative that you return to your primary care physician (or establish a relationship with a primary care physician if you do not have one) for your aftercare needs so that they can reassess your need for medications and monitor your lab values.    On the day of Discharge:  VITAL SIGNS:   Blood pressure 122/86, pulse (!) 57, temperature 97.8 F (36.6 C), temperature source Oral, resp. rate 16, height 5\' 5"  (1.651 m), weight 85.7 kg (189 lb), SpO2 97 %.  PHYSICAL EXAMINATION:    GENERAL:  72 y.o.-year-old patient lying in the bed with no acute distress.  EYES: Pupils equal, round, reactive to light and accommodation. No scleral icterus. Extraocular muscles intact.  HEENT: Head atraumatic, normocephalic. Oropharynx and nasopharynx clear.  NECK:  Supple, no jugular venous distention. No thyroid enlargement, no tenderness.  LUNGS: Normal breath sounds bilaterally, no wheezing, rales,rhonchi or crepitation. No use of accessory muscles of respiration.  CARDIOVASCULAR: S1, S2 normal. No murmurs, rubs, or gallops.  ABDOMEN: Soft, nontender,  nondistended. Bowel sounds present. No organomegaly or mass.  EXTREMITIES: No pedal edema, cyanosis, or clubbing.  NEUROLOGIC: Cranial nerves II through XII are intact. Muscle strength 5/5 in all extremities. Sensation intact. Gait not checked.  PSYCHIATRIC: The patient is alert and oriented x 3.  SKIN: No obvious rash, lesion, or ulcer.     DATA REVIEW:   CBC Recent Labs  Lab 10/18/17 1155  WBC 5.1  HGB 15.0  HCT 45.0  PLT 205    Chemistries  Recent Labs  Lab 10/20/17 0413  NA 140  K 4.0  CL 107  CO2 26  GLUCOSE 100*  BUN 18  CREATININE 1.08*  CALCIUM 8.7*  AST 26  ALT 15  ALKPHOS 65  BILITOT 1.0     Microbiology Results  Results for orders placed or performed in visit on 12/19/14  Fecal occult blood, imunochemical     Status: None   Collection Time: 12/19/14  5:19 PM  Result Value Ref Range Status   Fecal Occult Bld Negative Negative Final    RADIOLOGY:  No results found.   Management plans discussed with the patient, family and they are in agreement.  CODE STATUS:     Code Status Orders  (From admission, onward)        Start     Ordered   10/18/17 1904  Full code  Continuous     10/18/17 1904    Code Status History    Date Active Date Inactive Code Status Order ID Comments User Context   This patient has a current code status but no historical code status.      TOTAL TIME TAKING CARE OF THIS PATIENT: 36 minutes.    Enid Baas M.D on 10/20/2017 at 1:38 PM  Between 7am to 6pm - Pager - [phone]  After 6pm go to [email or web address] - password [email or web address] [facility]  [employer]  Hospitalists  Office  [phone]  CC: Primary care physician; Joaquim Nam, MD   Note: This dictation was prepared with Dragon dictation along with smaller phrase technology. Any transcriptional errors that result from this process are unintentional.

## 2017-10-20 NOTE — Evaluation (Addendum)
Clinical/Bedside Swallow Evaluation Patient Details  Name: Rebecca Moran MRN: [id number] Date of Birth: 1945-10-01  Today's Date: 10/20/2017 Time: SLP Start Time (ACUTE ONLY): 1150 SLP Stop Time (ACUTE ONLY): 1250 SLP Time Calculation (min) (ACUTE ONLY): 60 min  Past Medical History:  Past Medical History:  Diagnosis Date  . Allergy    seasonal  . Family history of cancer   . Hearing loss    in L ear  . History of migraines    MRI -head,secondary H?A L_OCCIP within normal limits 07/1999//CT of head without Normal ([facility]) 12/21/2007  . Hyperlipidemia   . NSVD (normal spontaneous vaginal delivery)    x 2  . Osteoporosis    Past Surgical History:  Past Surgical History:  Procedure Laterality Date  . ABDOMINAL HYSTERECTOMY  11/99   partial hysterectomy ovaries intact (fibroids  . normal vaginal delivery x2 [facility]    . stress cardiolite  04/28/2001   stress cardiolite wnl, EF 69%  . TUBAL LIGATION  1969   HPI:  Pt  is a 72 y.o. female with a known history per below presenting with acute chest pressure/heaviness, feels like a bricks laying on her anterior chest for 30-50 minutes, dyspnea on exertion, nausea, diaphoresis, "heart quivering", radiating into the jaw She had similar episode in July 2018 associated with nausea/emesis while doing work outside-at that time she thought it was related to the heat. Pt endorsed severeal s/s of Reflux-like behaviors and reported he has a history of ESOPHAGEAL dysmotility and notes that food will occasionally get stuck in her esophagus. She described having increased phlegm and the need to regurgitate after meals at times. Pt and family stated these s/s having been ongoing for ~2 yrs.     Assessment / Plan / Recommendation Clinical Impression  Pt appears to present w/ adequate oropharyngeal phase swallow function w/ reduced risk for aspiration from an oropharyngeal phase standpoint. Pt has had s/s of Esophageal dysmotility for  ~2 yrs. Pt would benefit from following up w/ GI for full assessment, following general Reflux precautions and aspiration precautions. Oral phase appeared wfl for bolus management and clearing. No OM weakness noted. Pharyngeal phase appeared wfl w/ no overt s/s of aspiration noted during/post trials(vocal quality clear, no coughing or throat clearing). Pt fed self given min setup assistance d/t being in bed. Discussed general precautions and behavioral changes to include reduce distractions and talking during meals; take rest breaks to allow for Esophageal clearing during meals; monitor posture when drinking/eating; small bites/sips slowly. Also discussed changes in foods/drink during meals to include refraining from Sodas/Carbonated drinks and lessen tough meats as well as breads; moisten foods well. Recommend continue w/ the current diet w/ thin liquids as ordered by MD; general precautions and REFLUX precautions. Recommend pills in puree if needed for easier swallowing. Recommend f/u w/ GI for Esophageal motility and further assessment. NSG updated.  SLP Visit Diagnosis: Dysphagia, unspecified (R13.10)(Esophageal dysmotility; Reflux-like s/s; regurgitation)    Aspiration Risk  (reduced from an oropharyngeal phase standpoint)    Diet Recommendation  regular diet w/ meats cut small, moistened foods (lessen/avoid meats as needed); thin liquids. General aspiration precautions; REFLUX precautions.   Medication Administration: Whole meds with puree(if easier for swallowing and clearing the Esophagus)    Other  Recommendations Recommended Consults: Consider GI evaluation;Consider esophageal assessment(to r/o stricture) Oral Care Recommendations: Oral care BID;Patient independent with oral care Other Recommendations: (n/a)   Follow up Recommendations None      Frequency and Duration (  n/a)  (n/a)       Prognosis Prognosis for Safe Diet Advancement: Good Barriers to Reach Goals: Severity of  deficits;Behavior(baseline GI complaints)      Swallow Study   General Date of Onset: 10/18/17 HPI: Pt  is a 72 y.o. female with a known history per below presenting with acute chest pressure/heaviness, feels like a bricks laying on her anterior chest for 30-50 minutes, dyspnea on exertion, nausea, diaphoresis, "heart quivering", radiating into the jaw She had similar episode in July 2018 associated with nausea/emesis while doing work outside-at that time she thought it was related to the heat. Pt endorsed severeal s/s of Reflux-like behaviors and reported he has a history of ESOPHAGEAL dysmotility and notes that food will occasionally get stuck in her esophagus. She described having increased phlegm and the need to regurgitate after meals at times. Pt and family stated these s/s having been ongoing for ~2 yrs.   Type of Study: Bedside Swallow Evaluation Previous Swallow Assessment: none Diet Prior to this Study: Regular;Thin liquids Temperature Spikes Noted: No(wbc 5.1) Respiratory Status: Room air History of Recent Intubation: No Behavior/Cognition: Alert;Cooperative;Pleasant mood Oral Cavity Assessment: Within Functional Limits Oral Care Completed by SLP: Recent completion by staff Oral Cavity - Dentition: Adequate natural dentition Vision: Functional for self-feeding Self-Feeding Abilities: Able to feed self Patient Positioning: Upright in bed Baseline Vocal Quality: Normal Volitional Cough: Strong Volitional Swallow: Able to elicit    Oral/Motor/Sensory Function Overall Oral Motor/Sensory Function: Within functional limits   Ice Chips Ice chips: Not tested   Thin Liquid Thin Liquid: Within functional limits Presentation: Cup;Self Fed;Straw(6-7 trials)    Nectar Thick Nectar Thick Liquid: Not tested   Honey Thick Honey Thick Liquid: Not tested   Puree Puree: Within functional limits Presentation: Self Fed;Spoon(3 trials)   Solid   GO   Solid: Within functional  limits Presentation: Self Fed;Spoon(several items at lunch meal including a bagel)    Functional Assessment Tool Used: clinical judgement Functional Limitations: Swallowing Swallow Current Status ([id number]): At least 1 percent but less than 20 percent impaired, limited or restricted Swallow Goal Status [phone]([id number]): At least 1 percent but less than 20 percent impaired, limited or restricted Swallow Discharge Status [phone]([id number]): At least 1 percent but less than 20 percent impaired, limited or restricted    Jerilynn SomKatherine Gino Garrabrant, MS, CCC-SLP Findley Blankenbaker 10/20/2017,3:12 PM

## 2017-10-20 NOTE — Plan of Care (Signed)
Pt is A&Ox4. VSS. RA . Ambulatory. Family at bedside. Pt went for stress test this shift. No complaints thus far. Will continue to monitor and report to oncoming RN .  Progressing Education: Knowledge of General Education information will improve 10/20/2017 1332 - Progressing by Jodie EchevariaWhite, Beatrice Ziehm L, RN Health Behavior/Discharge Planning: Ability to manage health-related needs will improve 10/20/2017 1332 - Progressing by Jodie EchevariaWhite, Hollace Michelli L, RN Clinical Measurements: Ability to maintain clinical measurements within normal limits will improve 10/20/2017 1332 - Progressing by Jodie EchevariaWhite, Quinette Hentges L, RN Will remain free from infection 10/20/2017 1332 - Progressing by Jodie EchevariaWhite, Jun Rightmyer L, RN Diagnostic test results will improve 10/20/2017 1332 - Progressing by Jodie EchevariaWhite, Iyah Laguna L, RN Respiratory complications will improve 10/20/2017 1332 - Progressing by Jodie EchevariaWhite, Ranson Belluomini L, RN Cardiovascular complication will be avoided 10/20/2017 1332 - Progressing by Jodie EchevariaWhite, Ercil Cassis L, RN Activity: Risk for activity intolerance will decrease 10/20/2017 1332 - Progressing by Jodie EchevariaWhite, Shelvia Fojtik L, RN Nutrition: Adequate nutrition will be maintained 10/20/2017 1332 - Progressing by Jodie EchevariaWhite, Bryelle Spiewak L, RN Coping: Level of anxiety will decrease 10/20/2017 1332 - Progressing by Jodie EchevariaWhite, Natalea Sutliff L, RN Elimination: Will not experience complications related to bowel motility 10/20/2017 1332 - Progressing by Jodie EchevariaWhite, Kiegan Macaraeg L, RN Will not experience complications related to urinary retention 10/20/2017 1332 - Progressing by Jodie EchevariaWhite, Rueben Kassim L, RN Pain Managment: General experience of comfort will improve 10/20/2017 1332 - Progressing by Jodie EchevariaWhite, Eufemio Strahm L, RN Safety: Ability to remain free from injury will improve 10/20/2017 1332 - Progressing by Jodie EchevariaWhite, Orlandus Borowski L, RN Skin Integrity: Risk for impaired skin integrity will decrease 10/20/2017 1332 - Progressing by Jodie EchevariaWhite, Kalis Friese L, RN

## 2017-10-20 NOTE — Care Management Obs Status (Signed)
MEDICARE OBSERVATION STATUS NOTIFICATION   Patient Details  Name: Rebecca Moran MRN: [id number] Date of Birth: 1946/08/27   Medicare Observation Status Notification Given:  Yes.  Documented in Epic 10/19/17 at 1400    Bevelyn NgoBOWEN, Jontae Sonier T, RN 10/20/2017, 10:18 AM

## 2017-10-21 ENCOUNTER — Telehealth: Payer: Self-pay | Admitting: *Deleted

## 2017-10-21 NOTE — Telephone Encounter (Signed)
Lm requesting return call to complete TCM and confirm hosp f/u appt  

## 2017-10-22 NOTE — Telephone Encounter (Signed)
Transition Care Management Follow-up Telephone Call  Date discharged? 10/20/2017  How have you been since you were released from the hospital? "just fine"   Do you understand why you were in the hospital? yes   Do you understand the discharge instructions? yes   Where were you discharged to? home   Items Reviewed:  Medications reviewed: yes  Functional Questionnaire:   Activities of Daily Living (ADLs):   She states they are independent in the following: pt is independent in all areas    Any transportation issues/concerns?: no   Any patient concerns? no   Confirmed importance and date/time of follow-up visits scheduled yes  Provider Appointment booked with Dr Para Marchuncan 02/01  Confirmed with patient if condition begins to worsen call PCP or go to the ER.  Patient was given the office number and encouraged to call back with question or concerns.  : yes

## 2017-10-24 ENCOUNTER — Ambulatory Visit (INDEPENDENT_AMBULATORY_CARE_PROVIDER_SITE_OTHER): Payer: Medicare Other | Admitting: Family Medicine

## 2017-10-24 ENCOUNTER — Encounter: Payer: Self-pay | Admitting: Family Medicine

## 2017-10-24 VITALS — BP 162/84 | HR 59 | Temp 97.4°F | Wt 187.5 lb

## 2017-10-24 DIAGNOSIS — R030 Elevated blood-pressure reading, without diagnosis of hypertension: Secondary | ICD-10-CM

## 2017-10-24 DIAGNOSIS — R1310 Dysphagia, unspecified: Secondary | ICD-10-CM | POA: Diagnosis not present

## 2017-10-24 MED ORDER — RIZATRIPTAN BENZOATE 10 MG PO TBDP: 10.0000 mg | ORAL_TABLET | ORAL | 2 refills | Status: DC | PRN

## 2017-10-24 NOTE — Patient Instructions (Addendum)
Small bites, eat slowly, cut back on junk foot, continue pantoprazole.  We will call about your referral. Take care.  Glad to see you.  Update me as needed.   If you BP stays >140/>90 then update me.

## 2017-10-24 NOTE — Progress Notes (Addendum)
MR#:  [id number]  DATE OF BIRTH:  1946-09-07  DATE OF ADMISSION:  10/18/2017      ADMITTING PHYSICIAN: Bertrum SolMontell D Salary, MD  DATE OF DISCHARGE: 10/20/17  PRIMARY CARE PHYSICIAN: Joaquim Namuncan, Davone Shinault S, MD   ADMISSION DIAGNOSIS:   Chest pain with moderate risk for cardiac etiology [R07.9]  DISCHARGE DIAGNOSIS:   Active Problems:   Chest pain   SECONDARY DIAGNOSIS:       Past Medical History:  Diagnosis Date  . Allergy    seasonal  . Family history of cancer   . Hearing loss    in L ear  . History of migraines    MRI -head,secondary H?A L_OCCIP within normal limits 07/1999//CT of head without Normal ([facility]) 12/21/2007  . Hyperlipidemia   . NSVD (normal spontaneous vaginal delivery)    x 2  . Osteoporosis     HOSPITAL COURSE:   72 year old female with no significant past medical history presents to hospital secondary to worsening chest pain.  1. Chest pain- likely secondary GERD -Troponins negative 3. Echocardiogram is normal. No further chest pain. -Patient cardiology consult. Myoview done and it is negative for any ischemia - will need GI workup as outpatient  2. GERD symptoms-no known diagnosis of GERD, -Started on Protonix this admission-will discharge on daily PPI -Maalox when necessary. Also concern for esophageal spasms versus stricture. -GI follow up and EGD as outpatient likely  3. Hyperlipidemia-on statin  Patient is independent and ambulatory Stable for discharge today  DISCHARGE CONDITIONS:   Guarded  CONSULTS OBTAINED:   Treatment Team:  Nahser, Deloris PingPhilip J, MD  DRUG ALLERGIES:        Allergies  Allergen Reactions  . Crestor [Rosuvastatin Calcium]     indigestion  . Fosamax [Alendronate Sodium] Other (See Comments)    GI intolerance, dysphagia  . Oxycodone-Acetaminophen     REACTION: Rash on hands  . Prednisone Other (See Comments)    Chest pain and elevated BP  . Sumatriptan     REACTION:  Tightness in chest   DISCHARGE MEDICATIONS:       Allergies as of 10/20/2017      Reactions   Crestor [rosuvastatin Calcium]    indigestion   Fosamax [alendronate Sodium] Other (See Comments)   GI intolerance, dysphagia   Oxycodone-acetaminophen    REACTION: Rash on hands   Prednisone Other (See Comments)   Chest pain and elevated BP   Sumatriptan    REACTION: Tightness in chest              Medication List     STOP taking these medications   ibuprofen 200 MG tablet Commonly known as:  ADVIL,MOTRIN     TAKE these medications   cetirizine 10 MG tablet Commonly known as:  ZYRTEC Take 10 mg by mouth daily.   Cholecalciferol 1000 units capsule Commonly known as:  CVS VITAMIN D3 Take 2 capsules (2,000 Units total) by mouth daily.   fluticasone 50 MCG/ACT nasal spray Commonly known as:  FLONASE Place 2 sprays into both nostrils as needed.   ondansetron 4 MG tablet Commonly known as:  ZOFRAN Take 1 tablet (4 mg total) by mouth every 8 (eight) hours as needed for nausea or vomiting.   pantoprazole 40 MG tablet Commonly known as:  PROTONIX Take 1 tablet (40 mg total) by mouth daily. Start taking on:  10/21/2017   pravastatin 40 MG tablet Commonly known as:  PRAVACHOL take 1 tablet by mouth once daily   rizatriptan 10 MG  disintegrating tablet Commonly known as:  MAXALT-MLT Take 1 tablet (10 mg total) by mouth as needed. May repeat in 2 hours if needed      DISCHARGE INSTRUCTIONS:   1. PCP follow-up in once 2 weeks 2. GI follow up in 2 weeks  ========================================== Admission for nonexertional chest pain and SOB. She didn't faint but felt lightheaded during the episode.   To ER, admitted.  Troponins negative 3. Echocardiogram is normal.  Patient cardiology consult. Myoview done and it is negative for any ischemia.    She had to put her dog down and her brother had cancer.  All of this is stressful for the  patient.  She had to take her brother to [facility] for admissions and treatments.  Her diet has been affected.  D/w pt.  She thought the stress of this contributed.   D/w pt about GI follow up.  She had to cancel her prev OV with GI, d/w pt.  She has some trouble with swallowing with mid-chest dysphagia/sx.  She can have food sticking and will occ need to vomit.  D/w pt about possible esophageal spasm vs stricture vs HH.  Routine mealtime cautions d/w pt.   She has a history of elevated blood pressure, noted today, this will require monitoring in the future.  She needed refill on maxal.   PMH and SH reviewed  ROS: Per HPI unless specifically indicated in ROS section   Meds, vitals, and allergies reviewed.   GEN: nad, alert and oriented HEENT: mucous membranes moist NECK: supple w/o LA CV: rrr PULM: ctab, no inc wob ABD: soft, +bs EXT: no edema SKIN: no acute rash

## 2017-10-26 NOTE — Assessment & Plan Note (Signed)
Likely the main issue with noncardiac CP.  D/w pt.  Needs GI f/u.  Continue PPI for now.  ddx d/w pt, possible esophageal spasm vs stricture vs HH vs GERD.   Refer to GI.  She agrees with plan.  Okay for outpatient f/u.

## 2017-11-02 ENCOUNTER — Telehealth: Payer: Self-pay | Admitting: Family Medicine

## 2017-11-02 NOTE — Telephone Encounter (Signed)
-----   Message from [place] OuchMuhammad A Arida, MD sent at 10/29/2017  4:16 PM EST ----- Both echo and stress test looked very good with no significant abnormalities.  I agree with you.  I would be happy to see her in the future if there is no other explanation for her symptoms.  Thanks.   ----- Message ----- From: Joaquim Namuncan, Sher Shampine S, MD Sent: 10/24/2017  10:36 AM To: [place] OuchMuhammad A Arida, MD  Patient was told to f/u with you after admission.  I think it is GI, not a cardiac issue.  I'm getting her set up with GI.  Do you still want to see her?  Clelia CroftShaw

## 2017-11-02 NOTE — Telephone Encounter (Signed)
Notify patient.  I talked with Dr. Kirke CorinArida.  He does not need to see the patient at this point.  She does not need routine cardiology follow-up at this point.  If she continued to have symptoms without any other explanation and she needed cardiac evaluation, then we could refer her at that point.  Thanks.

## 2017-11-03 NOTE — Telephone Encounter (Signed)
Patient advised.

## 2017-11-18 ENCOUNTER — Other Ambulatory Visit: Payer: Self-pay | Admitting: Gastroenterology

## 2017-11-18 DIAGNOSIS — K219 Gastro-esophageal reflux disease without esophagitis: Secondary | ICD-10-CM | POA: Diagnosis not present

## 2017-11-18 DIAGNOSIS — Z1211 Encounter for screening for malignant neoplasm of colon: Secondary | ICD-10-CM | POA: Diagnosis not present

## 2017-11-18 DIAGNOSIS — R1310 Dysphagia, unspecified: Secondary | ICD-10-CM

## 2017-11-23 NOTE — Assessment & Plan Note (Signed)
She has a history of elevated blood pressure, noted today, this will require monitoring in the future.

## 2017-12-05 ENCOUNTER — Ambulatory Visit
Admission: RE | Admit: 2017-12-05 | Discharge: 2017-12-05 | Disposition: A | Discharge status: Home / Self Care | Payer: Medicare Other | Source: Ambulatory Visit | Attending: Gastroenterology | Admitting: Gastroenterology

## 2017-12-05 DIAGNOSIS — K449 Diaphragmatic hernia without obstruction or gangrene: Secondary | ICD-10-CM | POA: Diagnosis not present

## 2017-12-05 DIAGNOSIS — K219 Gastro-esophageal reflux disease without esophagitis: Secondary | ICD-10-CM | POA: Diagnosis not present

## 2017-12-05 DIAGNOSIS — R1310 Dysphagia, unspecified: Secondary | ICD-10-CM | POA: Insufficient documentation

## 2017-12-08 ENCOUNTER — Telehealth: Payer: Self-pay | Admitting: Family Medicine

## 2017-12-08 NOTE — Telephone Encounter (Signed)
Copied from CRM [id number]. Topic: Quick Communication - See Telephone Encounter >> Dec 08, 2017  8:47 AM Clack, Princella PellegriniJessica D wrote: CRM for notification. See Telephone encounter for: Pt would like a call back for results on her DG Esophagus.  Please f/u with pt.  12/08/17.

## 2017-12-08 NOTE — Telephone Encounter (Signed)
See needs to check with GI for their input.  Thanks.

## 2017-12-08 NOTE — Telephone Encounter (Signed)
Patient advised.

## 2017-12-16 ENCOUNTER — Other Ambulatory Visit: Payer: Self-pay | Admitting: Family Medicine

## 2018-01-12 ENCOUNTER — Encounter: Payer: Self-pay | Admitting: *Deleted

## 2018-01-13 ENCOUNTER — Ambulatory Visit: Payer: Medicare Other | Admitting: Anesthesiology

## 2018-01-13 ENCOUNTER — Encounter: Payer: Self-pay | Admitting: *Deleted

## 2018-01-13 ENCOUNTER — Ambulatory Visit
Admission: RE | Admit: 2018-01-13 | Discharge: 2018-01-13 | Disposition: A | Discharge status: Home / Self Care | Payer: Medicare Other | Source: Ambulatory Visit | Attending: Gastroenterology | Admitting: Gastroenterology

## 2018-01-13 ENCOUNTER — Encounter
Admission: RE | Disposition: A | Discharge status: Home / Self Care | Payer: Self-pay | Source: Ambulatory Visit | Attending: Gastroenterology

## 2018-01-13 DIAGNOSIS — K2950 Unspecified chronic gastritis without bleeding: Secondary | ICD-10-CM | POA: Diagnosis not present

## 2018-01-13 DIAGNOSIS — Z87891 Personal history of nicotine dependence: Secondary | ICD-10-CM | POA: Insufficient documentation

## 2018-01-13 DIAGNOSIS — K449 Diaphragmatic hernia without obstruction or gangrene: Secondary | ICD-10-CM | POA: Insufficient documentation

## 2018-01-13 DIAGNOSIS — Z885 Allergy status to narcotic agent status: Secondary | ICD-10-CM | POA: Diagnosis not present

## 2018-01-13 DIAGNOSIS — K208 Other esophagitis: Secondary | ICD-10-CM | POA: Insufficient documentation

## 2018-01-13 DIAGNOSIS — K3189 Other diseases of stomach and duodenum: Secondary | ICD-10-CM | POA: Diagnosis not present

## 2018-01-13 DIAGNOSIS — K5790 Diverticulosis of intestine, part unspecified, without perforation or abscess without bleeding: Secondary | ICD-10-CM | POA: Diagnosis not present

## 2018-01-13 DIAGNOSIS — Z79899 Other long term (current) drug therapy: Secondary | ICD-10-CM | POA: Diagnosis not present

## 2018-01-13 DIAGNOSIS — Z1211 Encounter for screening for malignant neoplasm of colon: Secondary | ICD-10-CM | POA: Diagnosis not present

## 2018-01-13 DIAGNOSIS — Z888 Allergy status to other drugs, medicaments and biological substances status: Secondary | ICD-10-CM | POA: Diagnosis not present

## 2018-01-13 DIAGNOSIS — Z7951 Long term (current) use of inhaled steroids: Secondary | ICD-10-CM | POA: Diagnosis not present

## 2018-01-13 DIAGNOSIS — Z8371 Family history of colonic polyps: Secondary | ICD-10-CM | POA: Diagnosis not present

## 2018-01-13 DIAGNOSIS — K5730 Diverticulosis of large intestine without perforation or abscess without bleeding: Secondary | ICD-10-CM | POA: Diagnosis not present

## 2018-01-13 DIAGNOSIS — E785 Hyperlipidemia, unspecified: Secondary | ICD-10-CM | POA: Diagnosis not present

## 2018-01-13 DIAGNOSIS — G43909 Migraine, unspecified, not intractable, without status migrainosus: Secondary | ICD-10-CM | POA: Insufficient documentation

## 2018-01-13 DIAGNOSIS — Z9109 Other allergy status, other than to drugs and biological substances: Secondary | ICD-10-CM | POA: Insufficient documentation

## 2018-01-13 DIAGNOSIS — K2980 Duodenitis without bleeding: Secondary | ICD-10-CM | POA: Diagnosis not present

## 2018-01-13 DIAGNOSIS — R1310 Dysphagia, unspecified: Secondary | ICD-10-CM | POA: Insufficient documentation

## 2018-01-13 DIAGNOSIS — K222 Esophageal obstruction: Secondary | ICD-10-CM | POA: Insufficient documentation

## 2018-01-13 DIAGNOSIS — K209 Esophagitis, unspecified: Secondary | ICD-10-CM | POA: Diagnosis not present

## 2018-01-13 HISTORY — DX: Headache, unspecified: R51.9

## 2018-01-13 HISTORY — PX: COLONOSCOPY WITH PROPOFOL: SHX5780

## 2018-01-13 HISTORY — DX: Headache: R51

## 2018-01-13 HISTORY — PX: ESOPHAGOGASTRODUODENOSCOPY (EGD) WITH PROPOFOL: SHX5813

## 2018-01-13 LAB — SURGICAL PATHOLOGY

## 2018-01-13 SURGERY — COLONOSCOPY WITH PROPOFOL
Anesthesia: General

## 2018-01-13 MED ORDER — SODIUM CHLORIDE 0.9 % IV SOLN
INTRAVENOUS | Status: DC
  Administered 2018-01-13: 10:00:00 via INTRAVENOUS

## 2018-01-13 MED ORDER — LIDOCAINE HCL (PF) 2 % IJ SOLN
INTRAMUSCULAR | Status: AC
  Filled 2018-01-13: qty 10

## 2018-01-13 MED ORDER — LIDOCAINE 2% (20 MG/ML) 5 ML SYRINGE
INTRAMUSCULAR | Status: DC | PRN
  Administered 2018-01-13: 30 mg via INTRAVENOUS

## 2018-01-13 MED ORDER — PROPOFOL 500 MG/50ML IV EMUL
INTRAVENOUS | Status: AC
  Filled 2018-01-13: qty 50

## 2018-01-13 MED ORDER — PROPOFOL 10 MG/ML IV BOLUS
INTRAVENOUS | Status: AC
  Filled 2018-01-13: qty 20

## 2018-01-13 MED ORDER — PROPOFOL 10 MG/ML IV BOLUS
INTRAVENOUS | Status: DC | PRN
  Administered 2018-01-13: 100 mg via INTRAVENOUS

## 2018-01-13 MED ORDER — FENTANYL CITRATE (PF) 100 MCG/2ML IJ SOLN
INTRAMUSCULAR | Status: AC
  Filled 2018-01-13: qty 2

## 2018-01-13 MED ORDER — PROPOFOL 500 MG/50ML IV EMUL
INTRAVENOUS | Status: DC | PRN
  Administered 2018-01-13: 140 ug/kg/min via INTRAVENOUS

## 2018-01-13 MED ORDER — PHENYLEPHRINE HCL 10 MG/ML IJ SOLN
INTRAMUSCULAR | Status: DC | PRN
  Administered 2018-01-13 (×2): 100 ug via INTRAVENOUS

## 2018-01-13 MED ORDER — FENTANYL CITRATE (PF) 100 MCG/2ML IJ SOLN
INTRAMUSCULAR | Status: DC | PRN
  Administered 2018-01-13 (×2): 50 ug via INTRAVENOUS

## 2018-01-13 NOTE — Transfer of Care (Signed)
Immediate Anesthesia Transfer of Care Note  Patient: Rebecca Moran  Procedure(s) Performed: COLONOSCOPY WITH PROPOFOL (N/A ) ESOPHAGOGASTRODUODENOSCOPY (EGD) WITH PROPOFOL (N/A )  Patient Location: PACU and Endoscopy Unit  Anesthesia Type:General  Level of Consciousness: awake, drowsy and patient cooperative  Airway & Oxygen Therapy: Patient Spontanous Breathing and Patient connected to nasal cannula oxygen  Post-op Assessment: Report given to RN and Post -op Vital signs reviewed and stable  Post vital signs: Reviewed and stable  Last Vitals:  Vitals Value Taken Time  BP    Temp    Pulse 65 01/13/2018 11:47 AM  Resp 12 01/13/2018 11:47 AM  SpO2 97 % 01/13/2018 11:47 AM  Vitals shown include unvalidated device data.  Last Pain:  Vitals:   01/13/18 0936  TempSrc:   PainSc: 4          Complications: No apparent anesthesia complications

## 2018-01-13 NOTE — Anesthesia Preprocedure Evaluation (Signed)
Anesthesia Evaluation  Patient identified by MRN, date of birth, ID band Patient awake    Reviewed: Allergy & Precautions, NPO status , Patient's Chart, lab work & pertinent test results  History of Anesthesia Complications (+) DIFFICULT AIRWAY and history of anesthetic complications  Airway Mallampati: III  TM Distance: <3 FB Neck ROM: Full    Dental no notable dental hx.    Pulmonary neg sleep apnea, neg COPD, former smoker,    breath sounds clear to auscultation- rhonchi (-) wheezing      Cardiovascular Exercise Tolerance: Good (-) hypertension(-) angina(-) CAD, (-) Past MI and (-) Cardiac Stents  Rhythm:Regular Rate:Normal - Systolic murmurs and - Diastolic murmurs    Neuro/Psych  Headaches, negative psych ROS   GI/Hepatic negative GI ROS, Neg liver ROS,   Endo/Other  negative endocrine ROSneg diabetes  Renal/GU negative Renal ROS     Musculoskeletal negative musculoskeletal ROS (+)   Abdominal (+) + obese,   Peds  Hematology negative hematology ROS (+)   Anesthesia Other Findings Past Medical History: No date: Allergy     Comment:  seasonal No date: Family history of cancer No date: Headache No date: Hearing loss     Comment:  in L ear No date: History of migraines     Comment:  MRI -head,secondary H?A L_OCCIP within normal limits               07/1999//CT of head without Normal [facility] 12/21/2007 No date: Hyperlipidemia No date: NSVD (normal spontaneous vaginal delivery)     Comment:  x 2 No date: Osteoporosis   Reproductive/Obstetrics                             Anesthesia Physical Anesthesia Plan  ASA: II  Anesthesia Plan: General   Post-op Pain Management:    Induction: Intravenous  PONV Risk Score and Plan: 2 and Propofol infusion  Airway Management Planned: Natural Airway  Additional Equipment:   Intra-op Plan:   Post-operative Plan:   Informed Consent:  I have reviewed the patients History and Physical, chart, labs and discussed the procedure including the risks, benefits and alternatives for the proposed anesthesia with the patient or authorized representative who has indicated his/her understanding and acceptance.   Dental advisory given  Plan Discussed with: CRNA and Anesthesiologist  Anesthesia Plan Comments:         Anesthesia Quick Evaluation

## 2018-01-13 NOTE — Op Note (Signed)
[facility] Gastroenterology Patient Name: Rebecca Moran Procedure Date: 01/13/2018 10:07 AM MRN: [id number] Account #: [id number] Date of Birth: 05-Dec-1945 Admit Type: Outpatient Age: [age] [facility] Gender: Female Note Status: Finalized Procedure:            Colonoscopy Indications:          Screening for colorectal malignant neoplasm Providers:            Christena DeemMartin U. Skulskie, MD Medicines:            Monitored Anesthesia Care Complications:        No immediate complications. Procedure:            Pre-Anesthesia Assessment:                       - ASA Grade Assessment: II - A patient with mild                        systemic disease.                       After obtaining informed consent, the colonoscope was                        passed under direct vision. Throughout the procedure,                        the patient's blood pressure, pulse, and oxygen                        saturations were monitored continuously. The Olympus                        PCF-H180AL colonoscope ( S#: [id number] ) was introduced                        through the anus with the intention of advancing to the                        cecum. The scope was advanced to the sigmoid colon                        before the procedure was aborted. Medications were                        given. The colonoscopy was extremely difficult due to                        restricted mobility of the colon. The quality of the                        bowel preparation was good. Findings:      Multiple small-mouthed diverticula were found in the sigmoid colon.      The scope was advanced to about a 30 -35 cm marking on the scope. There       is redundant sigmoid colon. Once at this position, I was unable to       advance the scope further due to apparent "tethered" effect, despite       abdominal support and multiple position changes. Impression:           -  Diverticulosis in the sigmoid colon.                  - No specimens collected. Recommendation:       - Discharge patient to home.                       - Perform a virtual colonoscopy at appointment to be                        scheduled. Procedure Code(s):    --- Professional ---                       [phone], 53, Colonoscopy, flexible; diagnostic, including                        collection of specimen(s) by brushing or washing, when                        performed (separate procedure) Diagnosis Code(s):    --- Professional ---                       Z12.11, Encounter for screening for malignant neoplasm                        of colon                       K57.30, Diverticulosis of large intestine without                        perforation or abscess without bleeding CPT copyright 2017 American Medical Association. All rights reserved. The codes documented in this report are preliminary and upon coder review may  be revised to meet current compliance requirements. [name], MD 01/13/2018 11:46:08 AM This report has been signed electronically. Number of Addenda: 0 Note Initiated On: 01/13/2018 10:07 AM Total Procedure Duration: 0 hours 35 minutes 31 seconds       [facility]

## 2018-01-13 NOTE — Anesthesia Postprocedure Evaluation (Signed)
Anesthesia Post Note  Patient: Rebecca Moran  Procedure(s) Performed: COLONOSCOPY WITH PROPOFOL (N/A ) ESOPHAGOGASTRODUODENOSCOPY (EGD) WITH PROPOFOL (N/A )  Patient location during evaluation: Endoscopy Anesthesia Type: General Level of consciousness: awake and alert and oriented Pain management: pain level controlled Vital Signs Assessment: post-procedure vital signs reviewed and stable Respiratory status: spontaneous breathing, nonlabored ventilation and respiratory function stable Cardiovascular status: blood pressure returned to baseline and stable Postop Assessment: no signs of nausea or vomiting Anesthetic complications: no     Last Vitals:  Vitals:   01/13/18 1146 01/13/18 1148  BP: 109/68   Pulse: 97 67  Resp: 16 13  Temp: (!) 36.1 C (!) 36.1 C  SpO2: 98% 97%    Last Pain:  Vitals:   01/13/18 1146  TempSrc: Tympanic  PainSc:                  Maurisio Ruddy

## 2018-01-13 NOTE — H&P (Signed)
Outpatient short stay form Pre-procedure 01/13/2018 10:22 AM Rebecca Deem MD  Primary Physician: Dr. Crawford Givens  Reason for visit: EGD and colonoscopy  History of present illness: Patient is a 72 year old female presenting today as above.  She has a history of some dysphagia has had this in the past as well.  She had a barium swallow done on 12/05/2017 this showing a prominent esophageal B ring as well as a moderate sized sliding hiatal hernia.  There was some mild gastroesophageal reflux.  She was not on a proton pump inhibitor until several months ago.  At that time placed on Protonix 40 mg a day and states that this has been of a lot of benefit and since that she started that medication she has had only 2 episodes of dysphagia.  She is also presenting today for colon cancer screening.  Her last colonoscopy was about 15 to 20 years ago.  There is a family history of colon polyps in a primary relative, brother.  She tolerated her prep well.  She occasionally takes an NSAID.    Current Facility-Administered Medications:  .  0.9 %  sodium chloride infusion, , Intravenous, Continuous, Rebecca Deem, MD .  0.9 %  sodium chloride infusion, , Intravenous, Continuous, Rebecca Deem, MD, Last Rate: 20 mL/hr at 01/13/18 1009  Medications Prior to Admission  Medication Sig Dispense Refill Last Dose  . cetirizine (ZYRTEC) 10 MG tablet Take 10 mg by mouth daily.     Past Week at Unknown time  . Cholecalciferol ([employer] VITAMIN D3) 1000 UNITS capsule Take 2 capsules (2,000 Units total) by mouth daily.   Past Week at Unknown time  . fluticasone (FLONASE) 50 MCG/ACT nasal spray Place 2 sprays into both nostrils as needed.    Past Week at Unknown time  . pantoprazole (PROTONIX) 40 MG tablet Take 1 tablet (40 mg total) by mouth daily. 30 tablet 2 Past Week at Unknown time  . pravastatin (PRAVACHOL) 40 MG tablet TAKE 1 TABLET BY MOUTH ONCE DAILY 90 tablet 3 Past Week at Unknown time  . rizatriptan  (MAXALT-MLT) 10 MG disintegrating tablet Take 1 tablet (10 mg total) by mouth as needed. May repeat in 2 hours if needed 10 tablet 2      Allergies  Allergen Reactions  . Crestor [Rosuvastatin Calcium]     indigestion  . Fosamax [Alendronate Sodium] Other (See Comments)    GI intolerance, dysphagia  . Oxycodone-Acetaminophen     REACTION: Rash on hands  . Prednisone Other (See Comments)    Chest pain and elevated BP  . Sumatriptan     REACTION: Tightness in chest     Past Medical History:  Diagnosis Date  . Allergy    seasonal  . Family history of cancer   . Headache   . Hearing loss    in L ear  . History of migraines    MRI -head,secondary H?A L_OCCIP within normal limits 07/1999//CT of head without Normal ([facility]) 12/21/2007  . Hyperlipidemia   . NSVD (normal spontaneous vaginal delivery)    x 2  . Osteoporosis     Review of systems:      Physical Exam    Heart and lungs: Without rub or gallop, lungs are bilaterally clear.    HEENT: Normocephalic atraumatic eyes are anicteric    Other:    Pertinant exam for procedure: Soft nontender nondistended bowel sounds positive normoactive.    Planned proceedures: EGD, colonoscopy and indicated procedures. I  have discussed the risks benefits and complications of procedures to include not limited to bleeding, infection, perforation and the risk of sedation and the patient wishes to proceed.    Rebecca DeemMartin U Kaizlee Carlino, MD Gastroenterology 01/13/2018  10:22 AM

## 2018-01-13 NOTE — Op Note (Signed)
[facility] G[facility]enter[facility] Patient Name: Rebecca Moran Procedure Date: 01/13/2018 10:07 AM MRN: [id number] Account #: [id number] Date of Birth: 08-Nov-1945 Admit Type: Outpatient Age: 89 [facility] Gender: Female Note Status: Finalized Procedure:            Upper GI endoscopy Indications:          Dysphagia Providers:            Christena DeemMartin U. Skulskie, MD Referring MD:         Dwana CurdGraham S. Para Marchuncan, MD (Referring MD) Medicines:            Monitored Anesthesia Care Complications:        No immediate complications. Procedure:            Pre-Anesthesia Assessment:                       - ASA Grade Assessment: II - A patient with mild                        systemic disease.                       After obtaining informed consent, the endoscope was                        passed under direct vision. Throughout the procedure,                        the patient's blood pressure, pulse, and oxygen                        saturations were monitored continuously. The Endoscope                        was introduced through the mouth, and advanced to the                        third part of duodenum. The upper GI endoscopy was                        accomplished without difficulty. The patient tolerated                        the procedure well. Findings:      Non-severe esophagitis with no bleeding was found.      A small hiatal hernia was found. The Z-line was a variable distance from       incisors; the hiatal hernia was sliding.      Diffuse mild inflammation characterized by erythema was found in the       gastric body. Biopsies were taken with a cold forceps for histology.       Biopsies were taken with a cold forceps for Helicobacter pylori testing.      The cardia and gastric fundus were normal on retroflexion otherwise.      Patchy mild inflammation characterized by congestion (edema) and       erythema was found in the duodenal bulb.      A low-grade of  narrowing Schatzki ring was found at the gastroesophageal       junction and a ring about 1.5 cm above this. A TTS dilator was passed  through the scope. Dilation with a 07-04-11 mm balloon and a 12-13.5-15       mm balloon dilator was performed to 13.5 mm with opening of the distal       and proximal ring noted. Impression:           - Non-severe erosive esophagitis.                       - Small hiatal hernia.                       - Gastritis. Biopsied.                       - Duodenitis.                       - Low-grade of narrowing Schatzki ring. Dilated. Recommendation:       - Discharge patient to home.                       - Use Protonix (pantoprazole) 40 mg PO BID for 2 weeks.                       - Use Protonix (pantoprazole) 40 mg PO daily daily.                       - Repeat upper endoscopy in 1 month to check healing                        and for retreatment.                       - Return to GI clinic in 4 weeks. Procedure Code(s):    --- Professional ---                       [phone], Esophagogastroduodenoscopy, flexible, transoral;                        with transendoscopic balloon dilation of esophagus                        (less than 30 mm diameter)                       43239, Esophagogastroduodenoscopy, flexible, transoral;                        with biopsy, single or multiple Diagnosis Code(s):    --- Professional ---                       K20.8, Other esophagitis                       K44.9, Diaphragmatic hernia without obstruction or                        gangrene                       K29.70, Gastritis, unspecified, without bleeding  K29.80, Duodenitis without bleeding                       K22.2, Esophageal obstruction                       R13.10, Dysphagia, unspecified CPT copyright 2017 American Medical Association. All rights reserved. The codes documented in this report are preliminary and upon coder review may  be revised  to meet current compliance requirements. [name], MD 01/13/2018 11:02:37 AM This report has been signed electronically. Number of Addenda: 0 Note Initiated On: 01/13/2018 10:07 AM      [facility]

## 2018-01-13 NOTE — Anesthesia Post-op Follow-up Note (Signed)
Anesthesia QCDR form completed.        

## 2018-01-14 ENCOUNTER — Encounter: Payer: Self-pay | Admitting: Gastroenterology

## 2018-01-21 ENCOUNTER — Other Ambulatory Visit: Payer: Self-pay | Admitting: Gastroenterology

## 2018-01-21 DIAGNOSIS — Q438 Other specified congenital malformations of intestine: Secondary | ICD-10-CM

## 2018-02-10 ENCOUNTER — Other Ambulatory Visit: Payer: Self-pay | Admitting: Family Medicine

## 2018-02-10 DIAGNOSIS — Q438 Other specified congenital malformations of intestine: Secondary | ICD-10-CM | POA: Diagnosis not present

## 2018-02-10 DIAGNOSIS — R1310 Dysphagia, unspecified: Secondary | ICD-10-CM | POA: Diagnosis not present

## 2018-02-10 DIAGNOSIS — K219 Gastro-esophageal reflux disease without esophagitis: Secondary | ICD-10-CM | POA: Diagnosis not present

## 2018-02-20 ENCOUNTER — Ambulatory Visit (INDEPENDENT_AMBULATORY_CARE_PROVIDER_SITE_OTHER): Payer: Medicare Other | Admitting: Family Medicine

## 2018-02-20 ENCOUNTER — Inpatient Hospital Stay
Admission: RE | Admit: 2018-02-20 | Discharge: 2018-02-20 | Disposition: A | Discharge status: Home / Self Care | Payer: Self-pay | Source: Ambulatory Visit | Attending: Gastroenterology | Admitting: Gastroenterology

## 2018-02-20 ENCOUNTER — Encounter: Payer: Self-pay | Admitting: Family Medicine

## 2018-02-20 VITALS — BP 122/86 | HR 73 | Temp 97.7°F | Ht 65.0 in | Wt 191.1 lb

## 2018-02-20 DIAGNOSIS — Z1211 Encounter for screening for malignant neoplasm of colon: Secondary | ICD-10-CM | POA: Diagnosis not present

## 2018-02-20 DIAGNOSIS — R1310 Dysphagia, unspecified: Secondary | ICD-10-CM

## 2018-02-20 DIAGNOSIS — E7800 Pure hypercholesterolemia, unspecified: Secondary | ICD-10-CM | POA: Diagnosis not present

## 2018-02-20 MED ORDER — PRAVASTATIN SODIUM 40 MG PO TABS
40.0000 mg | ORAL_TABLET | Freq: Every day | ORAL | 3 refills | Status: DC
Start: 2018-02-20 — End: 2018-11-02

## 2018-02-20 NOTE — Progress Notes (Signed)
Due for mammogram, d/w pt.  See AVS.    Her headaches got better when she retired.   Elevated Cholesterol: Using medications without problems:yes Muscle aches: no Diet compliance: encouraged.   Exercise: encouraged Prev lipids reasonable.     No FH colon cancer.  Prev colonoscopy done about 10 years ago.  She had tortuous colon and couldn't have complete exam recently.  D/w pt about options.  "Tethered" effect of colon.  Prev abd surgery- hysterectomy.    She wanted to get set up with GI for a second opinion re: colon cancer screening and in case she had need for f/u EGD.  Unclear to me what would be the best option for colon cancer screening and I need GI input on that, dw pt.   She had recent EGD done with dilation done.  Neg bx, d/w pt.  Still on PPI daily.  No dysphagia now.  She was asking about GI f/u.  See above.    PMH and SH reviewed  ROS: Per HPI unless specifically indicated in ROS section   Meds, vitals, and allergies reviewed.   GEN: nad, alert and oriented HEENT: mucous membranes moist NECK: supple w/o LA CV: rrr. PULM: ctab, no inc wob ABD: soft, +bs EXT: no edema SKIN: no acute rash

## 2018-02-20 NOTE — Patient Instructions (Addendum)
You can call for a mammogram at [facility] at [facility].  [address] [place] [phone]  Tell them you have seen me in the meantime.    Don't change your meds in the meantime.   We will call about your referral.  Shirlee LimerickMarion or Alvina Chounastasiya will call you if you don't see one of them on the way out.  Take care.  Glad to see you.

## 2018-02-22 DIAGNOSIS — Z1211 Encounter for screening for malignant neoplasm of colon: Secondary | ICD-10-CM | POA: Insufficient documentation

## 2018-02-22 NOTE — Assessment & Plan Note (Signed)
Improved status post EGD without dysphagia now.  Refer to GI for second opinion in case she needs follow-up on this.  No change in medications at this point.  She agrees. >25 minutes spent in face to face time with patient, >50% spent in counselling or coordination of care, discussing mammogram need, previous lipids, EGD report, colon cancer screening, etc.

## 2018-02-22 NOTE — Assessment & Plan Note (Signed)
Prev lipids reasonable.   Continue as is.  Discussed with patient about diet and exercise.

## 2018-02-22 NOTE — Assessment & Plan Note (Signed)
Refer to GI for second opinion.  I do not know if it would be best for her to do Cologuard or if she should have a virtual colonoscopy.  I appreciate GI input.

## 2018-02-24 ENCOUNTER — Telehealth: Payer: Self-pay | Admitting: Gastroenterology

## 2018-02-24 NOTE — Telephone Encounter (Signed)
All records are in Epic. Sent to DOD 02/20/18 AM Dr.Nandigam's attention.

## 2018-02-26 NOTE — Telephone Encounter (Signed)
Dr Para Marchuncan can consider virtual colonoscopy with CT given she had incomplete colonoscopy due to tortous colon.

## 2018-03-02 NOTE — Telephone Encounter (Signed)
Okay to schedule her with an extender to evaluate her dysphagia?

## 2018-03-03 NOTE — Telephone Encounter (Signed)
ok 

## 2018-03-04 ENCOUNTER — Encounter: Payer: Self-pay | Admitting: Gastroenterology

## 2018-03-04 NOTE — Telephone Encounter (Signed)
Patient has been scheduled to see Doug SouJessica Zehr, PA-C 03/17/18 at 9am.

## 2018-03-17 ENCOUNTER — Ambulatory Visit: Payer: Medicare Other | Admitting: Gastroenterology

## 2018-03-17 ENCOUNTER — Encounter: Payer: Self-pay | Admitting: Gastroenterology

## 2018-03-17 VITALS — BP 130/70 | HR 71 | Ht 65.5 in | Wt 191.0 lb

## 2018-03-17 DIAGNOSIS — Z1211 Encounter for screening for malignant neoplasm of colon: Secondary | ICD-10-CM

## 2018-03-17 NOTE — Patient Instructions (Signed)
Your provider has ordered Cologuard testing as an option for colon cancer screening. This is performed by [facility] and may be out of network with your insurance. PRIOR to completing the test, it is YOUR responsibility to contact your insurance about covered benefits for this test. Your out of pocket expense could be anywhere from $0.00 to $649.00.   When you call to check coverage with your insurer, please provide the following information:   -The ONLY provider of Cologuard is [facility]  - CPT code for Cologuard is 81528.  -[facility] NPI # [id number]  -[facility] Tax ID # [id number]   We have already sent your demographic and insurance information to [facility] (phone number [phone]) and they should contact you within the next week regarding your test. If you have not heard from them within the next week, please call our office at [phone].    

## 2018-03-17 NOTE — Progress Notes (Signed)
03/17/2018 Rebecca Moran [id number] 1946-05-03   HISTORY OF PRESENT ILLNESS: This is a 72 year old female who is new to our practice.  She presents here today at the request of her PCP, Dr. Para Marchuncan, in order to discuss virtual CT colonography.  She had colonoscopy and EGD with dilation by Dr. Marva PandaSkulskie at [facility] on January 13, 2018.  A colonoscopy was performed for screening purposes, but due to redundant colon and "tethered effect", it was unable to be completed.  He recommended a CT colonography, but she says that where they scheduled that study it was not covered by her insurance.  Also she was not satisfied at their facility so want to come here instead.  She was complaining of dysphasia, which is why she was seen there initially and she underwent a esophagram and then subsequent EGD with dilation.  Esophagus was dilated for mild Schatzki's ring.  She no longer has any complaints of dysphasia.  She has no GI complaints today actually.   Past Medical History:  Diagnosis Date  . Allergy    seasonal  . Family history of cancer   . Headache   . Hearing loss    in L ear  . History of migraines    MRI -head,secondary H?A L_OCCIP within normal limits 07/1999//CT of head without Normal ([facility]) 12/21/2007  . Hyperlipidemia   . NSVD (normal spontaneous vaginal delivery)    x 2  . Osteoporosis    Past Surgical History:  Procedure Laterality Date  . ABDOMINAL HYSTERECTOMY  11/99   partial hysterectomy ovaries intact (fibroids  . COLONOSCOPY WITH PROPOFOL N/A 01/13/2018   Procedure: COLONOSCOPY WITH PROPOFOL;  Surgeon: Christena DeemSkulskie, Martin U, MD;  [facility] END[facility]COPY;  Service: Endoscopy;  Laterality: N/A;  . ESOPHAGOGASTRODUODENOSCOPY (EGD) WITH PROPOFOL N/A 01/13/2018   Procedure: ESOPHAGOGASTRODUODENOSCOPY (EGD) WITH PROPOFOL;  Surgeon: Christena DeemSkulskie, Martin U, MD;  Location: [facility] ENDOSCOPY;  Service: Endoscopy;  Laterality: N/A;  . normal vaginal delivery x2 [facility]      . stress cardiolite  04/28/2001   stress cardiolite wnl, EF 69%  . TUBAL LIGATION  1969    reports that she quit smoking about 36 years ago. She has a 44.00 pack-year smoking history. She has never used smokeless tobacco. She reports that she does not drink alcohol or use drugs. family history includes Bone cancer in her sister; Breast cancer in her sister; Cancer in her brother and sister; Cancer (age of onset: 1450) in her mother; Cancer (age of onset: 576) in her father; Depression in her maternal grandfather; Diabetes in her brother and mother; Heart disease in her paternal grandfather; Heart disease (age of onset: 3867) in her mother; Ovarian cancer in her sister; Stroke in her maternal grandmother. Allergies  Allergen Reactions  . Crestor [Rosuvastatin Calcium]     indigestion  . Fosamax [Alendronate Sodium] Other (See Comments)    GI intolerance, dysphagia  . Oxycodone-Acetaminophen     REACTION: Rash on hands  . Prednisone Other (See Comments)    Chest pain and elevated BP  . Sumatriptan     REACTION: Tightness in chest      Outpatient Encounter Medications as of 03/17/2018  Medication Sig  . cetirizine (ZYRTEC) 10 MG tablet Take 10 mg by mouth daily.    . Cholecalciferol (CVS VITAMIN D3) 1000 UNITS capsule Take 2 capsules (2,000 Units total) by mouth daily.  . fluticasone (FLONASE) 50 MCG/ACT nasal spray Place 2 sprays into both nostrils as needed.   .Marland Kitchen  pantoprazole (PROTONIX) 40 MG tablet Take 1 tablet (40 mg total) by mouth daily.  . pravastatin (PRAVACHOL) 40 MG tablet Take 1 tablet (40 mg total) by mouth daily.  . rizatriptan (MAXALT-MLT) 10 MG disintegrating tablet Take 1 tablet (10 mg total) by mouth as needed. May repeat in 2 hours if needed   No facility-administered encounter medications on file as of 03/17/2018.      REVIEW OF SYSTEMS  : All other systems reviewed and negative except where noted in the History of Present Illness.   PHYSICAL EXAM: BP 130/70   Pulse  71   Ht 5' 5.5" (1.664 m)   Wt 191 lb (86.6 kg)   BMI 31.30 kg/m  General: Well developed white female in no acute distress Head: Normocephalic and atraumatic Eyes:  Sclerae anicteric, conjunctiva pink. Ears: Normal auditory acuity Lungs: Clear throughout to auscultation; no W/R/R. Heart: Regular rate and rhythm; no M/R/G. Abdomen: Soft, non-distended.  BS present.  Non-tender. Musculoskeletal: Symmetrical with no gross deformities  Skin: No lesions on visible extremities Extremities: No edema  Neurological: Alert oriented x 4, grossly non-focal Psychological:  Alert and cooperative. Normal mood and affect  ASSESSMENT AND PLAN: *Screening for CRC/incomplete colonoscopy:  Redundant sigmoid colon and "tethered effect" making procedure difficult and unable to be completed.  Unfortunately, virtual CT colonography was recommended but her insurance will not pay for this.  The only other option that I have for her is to perform a Cologuard study.  She is familiar with this as her husband did one.  Will order.   CC:  Joaquim Nam, MD

## 2018-03-20 NOTE — Progress Notes (Signed)
Reviewed and agree with documentation and assessment and plan. K. Veena Nandigam , MD   

## 2018-03-31 DIAGNOSIS — Z1212 Encounter for screening for malignant neoplasm of rectum: Secondary | ICD-10-CM | POA: Diagnosis not present

## 2018-03-31 DIAGNOSIS — Z1211 Encounter for screening for malignant neoplasm of colon: Secondary | ICD-10-CM | POA: Diagnosis not present

## 2018-04-02 ENCOUNTER — Other Ambulatory Visit: Payer: Self-pay | Admitting: Family Medicine

## 2018-04-02 DIAGNOSIS — Z1231 Encounter for screening mammogram for malignant neoplasm of breast: Secondary | ICD-10-CM

## 2018-04-16 ENCOUNTER — Ambulatory Visit
Admission: RE | Admit: 2018-04-16 | Discharge: 2018-04-16 | Disposition: A | Discharge status: Home / Self Care | Payer: Medicare Other | Source: Ambulatory Visit | Attending: Family Medicine | Admitting: Family Medicine

## 2018-04-16 DIAGNOSIS — Z1231 Encounter for screening mammogram for malignant neoplasm of breast: Secondary | ICD-10-CM | POA: Insufficient documentation

## 2018-04-24 ENCOUNTER — Other Ambulatory Visit: Payer: Self-pay

## 2018-04-24 LAB — COLOGUARD: Cologuard: NEGATIVE

## 2018-05-26 ENCOUNTER — Other Ambulatory Visit: Payer: Self-pay | Admitting: Family Medicine

## 2018-05-26 NOTE — Telephone Encounter (Signed)
Copied from CRM [id number]. Topic: Quick Communication - Rx Refill/Question >> May 26, 2018  9:41 AM Maia Petties wrote: Medication: pantoprazole - pt states that she was going to [facility] for this medicine before but she isn't going there anymore. She is wanting Dr. Para March to take over this medicaton. Requests refills for 90 day supply at a time.  Has the patient contacted their pharmacy? Yes Preferred Pharmacy (with phone number or street name): [id number] - [place], [place] - [address] AT [facility] [address] & [address] [phone] (Phone) [phone] (Fax)

## 2018-05-26 NOTE — Telephone Encounter (Signed)
Last seen 02/20/18.Please advise.

## 2018-05-27 MED ORDER — PANTOPRAZOLE SODIUM 40 MG PO TBEC: 40.0000 mg | DELAYED_RELEASE_TABLET | Freq: Every day | ORAL | 3 refills | Status: DC

## 2018-05-27 NOTE — Telephone Encounter (Signed)
Sent. Thanks.   

## 2018-05-31 ENCOUNTER — Other Ambulatory Visit: Payer: Self-pay | Admitting: Family Medicine

## 2018-05-31 DIAGNOSIS — M810 Age-related osteoporosis without current pathological fracture: Secondary | ICD-10-CM

## 2018-05-31 DIAGNOSIS — E7800 Pure hypercholesterolemia, unspecified: Secondary | ICD-10-CM

## 2018-06-02 ENCOUNTER — Ambulatory Visit (INDEPENDENT_AMBULATORY_CARE_PROVIDER_SITE_OTHER): Payer: Medicare Other

## 2018-06-02 VITALS — BP 118/82 | HR 56 | Temp 97.8°F | Ht 65.0 in | Wt 194.5 lb

## 2018-06-02 DIAGNOSIS — M810 Age-related osteoporosis without current pathological fracture: Secondary | ICD-10-CM | POA: Diagnosis not present

## 2018-06-02 DIAGNOSIS — E7800 Pure hypercholesterolemia, unspecified: Secondary | ICD-10-CM

## 2018-06-02 DIAGNOSIS — Z0000 Encounter for general adult medical examination without abnormal findings: Secondary | ICD-10-CM

## 2018-06-02 LAB — LIPID PANEL
CHOL/HDL RATIO: 4
Cholesterol: 200 mg/dL (ref 0–200)
HDL: 51.6 mg/dL (ref >39.00)
LDL Cholesterol: 123 mg/dL — ABNORMAL HIGH (ref 0–99)
NonHDL: 148.31
Triglycerides: 126 mg/dL (ref 0.0–149.0)
VLDL: 25.2 mg/dL (ref 0.0–40.0)

## 2018-06-02 LAB — COMPREHENSIVE METABOLIC PANEL
ALK PHOS: 68 U/L (ref 39–117)
ALT: 15 U/L (ref 0–35)
AST: 19 U/L (ref 0–37)
Albumin: 4.1 g/dL (ref 3.5–5.2)
BUN: 16 mg/dL (ref 6–23)
CHLORIDE: 105 meq/L (ref 96–112)
CO2: 32 mEq/L (ref 19–32)
Calcium: 9.7 mg/dL (ref 8.4–10.5)
Creatinine, Ser: 1.22 mg/dL — ABNORMAL HIGH (ref 0.40–1.20)
GFR: 46.02 mL/min — AB (ref >60.00)
GLUCOSE: 95 mg/dL (ref 70–99)
POTASSIUM: 3.9 meq/L (ref 3.5–5.1)
SODIUM: 142 meq/L (ref 135–145)
Total Bilirubin: 1.3 mg/dL — ABNORMAL HIGH (ref 0.2–1.2)
Total Protein: 7.1 g/dL (ref 6.0–8.3)

## 2018-06-02 LAB — VITAMIN D 25 HYDROXY (VIT D DEFICIENCY, FRACTURES): VITD: 82.81 ng/mL (ref 30.00–100.00)

## 2018-06-02 NOTE — Progress Notes (Signed)
Subjective:   Rebecca Moran is a 72 y.o. female who presents for Medicare Annual (Subsequent) preventive examination.  Review of Systems:  N/A Cardiac Risk Factors include: advanced age (>49men, >29 women);obesity (BMI >30kg/m2);dyslipidemia     Objective:     Vitals: BP 118/82 (BP Location: Left Arm, Patient Position: Sitting, Cuff Size: Normal)   Pulse (!) 56   Temp 97.8 F (36.6 C) (Oral)   Ht 5\' 5"  (1.651 m)   Wt 194 lb 8 oz (88.2 kg)   SpO2 98%   BMI 32.37 kg/m   Body mass index is 32.37 kg/m.  Advanced Directives 06/02/2018 01/13/2018 10/18/2017 06/13/2016  Does Patient Have a Medical Advance Directive? No No No No  Would patient like information on creating a medical advance directive? No - Patient declined No - Patient declined No - Patient declined Yes - Transport planner given    Tobacco Social History   Tobacco Use  Smoking Status Former Smoker  . Packs/day: 2.00  . Years: 22.00  . Pack years: 44.00  . Last attempt to quit: 09/23/1981  . Years since quitting: 36.7  Smokeless Tobacco Never Used  Tobacco Comment   quit in 1975     Counseling given: No Comment: quit in 1975   Clinical Intake:  Pre-visit preparation completed: Yes  Pain : No/denies pain Pain Score: 0-No pain     Nutritional Status: BMI > 30  Obese Nutritional Risks: None Diabetes: No  How often do you need to have someone help you when you read instructions, pamphlets, or other written materials from your doctor or pharmacy?: 1 - Never What is the last grade level you completed in school?: 11th grade + some of 12th grade  Interpreter Needed?: No  Comments: pt lives with spouse Information entered by :: LPinson, LPN  Past Medical History:  Diagnosis Date  . Allergy    seasonal  . Family history of cancer   . Headache   . Hearing loss    in L ear  . History of migraines    MRI -head,secondary H?A L_OCCIP within normal limits 07/1999//CT of head without Normal  ([facility]) 12/21/2007  . Hyperlipidemia   . NSVD (normal spontaneous vaginal delivery)    x 2  . Osteoporosis    Past Surgical History:  Procedure Laterality Date  . ABDOMINAL HYSTERECTOMY  11/99   partial hysterectomy ovaries intact (fibroids  . COLONOSCOPY WITH PROPOFOL N/A 01/13/2018   Procedure: COLONOSCOPY WITH PROPOFOL;  Surgeon: [name], MD;  Location: [facility] ENDOSCOPY;  Service: Endoscopy;  Laterality: N/A;  . ESOPHAGOGASTRODUODENOSCOPY (EGD) WITH PROPOFOL N/A 01/13/2018   Procedure: ESOPHAGOGASTRODUODENOSCOPY (EGD) WITH PROPOFOL;  Surgeon: [name], MD;  Location: [facility] ENDOSCOPY;  Service: Endoscopy;  Laterality: N/A;  . normal vaginal delivery x2 [facility]    . stress cardiolite  04/28/2001   stress cardiolite wnl, EF 69%  . TUBAL LIGATION  1969   Family History  Problem Relation Age of Onset  . Heart disease Mother 7       CHF  . Cancer Mother 70       lung cancer since her 39's with kidney cancer //cancer of the throat and tongue //never smoked or drank  . Diabetes Mother        borderline  . Cancer Father 49       melanoma  . Cancer Sister        breast cancer and ovarian  . Breast cancer Sister   .  Ovarian cancer Sister   . Bone cancer Sister   . Heart disease Paternal Grandfather        MI  . Diabetes Brother   . Cancer Brother        lung cancer  . Stroke Maternal Grandmother   . Depression Maternal Grandfather   . Colon cancer Neg Hx    Social History   Socioeconomic History  . Marital status: Married    Spouse name: Not on file  . Number of children: 2  . Years of education: Not on file  . Highest education level: Not on file  Occupational History  . 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 Financial resource strain: Not on file  . Food insecurity:    Worry: Not on file    Inability: Not on file  . Transportation needs:    Medical: Not on  file    Non-medical: Not on file  Tobacco Use  . Smoking status: Former Smoker    Packs/day: 2.00    Years: 22.00    Pack years: 44.00    Last attempt to quit: 09/23/1981    Years since quitting: 36.7  . Smokeless tobacco: Never Used  . Tobacco comment: quit in 1975  Substance and Sexual Activity  . Alcohol use: No  . Drug use: No  . Sexual activity: Never  Lifestyle  . Physical activity:    Days per week: Not on file    Minutes per session: Not on file  . Stress: Not on file  Relationships  . Social connections:    Talks on phone: Not on file    Gets together: Not on file    Attends religious service: Not on file    Active member of club or organization: Not on file    Attends meetings of clubs or organizations: Not on file    Relationship status: Not on file  Other Topics Concern  . Not on file  Social History Narrative   Married- 1967   Two stepchildren and two children of her own.   retired    Outpatient Encounter Medications as of 06/02/2018  Medication Sig  . cetirizine (ZYRTEC) 10 MG tablet Take 10 mg by mouth daily.    . Cholecalciferol (CVS VITAMIN D3) 1000 UNITS capsule Take 2 capsules (2,000 Units total) by mouth daily.  . fluticasone (FLONASE) 50 MCG/ACT nasal spray Place 2 sprays into both nostrils as needed.   . pantoprazole (PROTONIX) 40 MG tablet Take 1 tablet (40 mg total) by mouth daily.  . pravastatin (PRAVACHOL) 40 MG tablet Take 1 tablet (40 mg total) by mouth daily.  . rizatriptan (MAXALT-MLT) 10 MG disintegrating tablet Take 1 tablet (10 mg total) by mouth as needed. May repeat in 2 hours if needed   No facility-administered encounter medications on file as of 06/02/2018.     Activities of Daily Living In your present state of health, do you have any difficulty performing the following activities: 06/02/2018 10/18/2017  Hearing? Malvin Johns  Comment concerns with hearing loss in left ear -  Vision? N N  Difficulty concentrating or making decisions? N N    Walking or climbing stairs? N N  Dressing or bathing? N N  Doing errands, shopping? N N  Preparing Food and eating ? N -  Using the Toilet? N -  In the past six months, have you accidently leaked urine? N -  Do you have problems with loss of bowel control? N -  Managing your Medications? N -  Managing your Finances? N -  Housekeeping or managing your Housekeeping? N -  Some recent data might be hidden    Patient Care Team: Joaquim Nam, MD as PCP - General (Family Medicine) Iran Ouch, MD as PCP - Cardiology (Cardiology)    Assessment:   This is a routine wellness examination for [place].   Hearing Screening   125Hz  250Hz  500Hz  1000Hz  2000Hz  3000Hz  4000Hz  6000Hz  8000Hz   Right ear:   40 40 40  0    Left ear:   0 0 0  0    Comments: Candidate for hearing aid in left ear - unable to wear   Visual Acuity Screening   Right eye Left eye Both eyes  Without correction: 20/20-1 20/20-2 20/20  With correction:       Exercise Activities and Dietary recommendations Current Exercise Habits: Home exercise routine, Type of exercise: walking, Exercise limited by: None identified  Goals    . Increase physical activity     Starting 06/02/2018, I will continue to walk for 10-20 minutes daily.        Fall Risk Fall Risk  06/02/2018 02/20/2018 06/13/2016 12/09/2014  Falls in the past year? No No No Yes   Depression Screen PHQ 2/9 Scores 06/02/2018 02/20/2018 06/13/2016 12/09/2014  PHQ - 2 Score 0 0 0 0  PHQ- 9 Score 0 - - -     Cognitive Function MMSE - Mini Mental State Exam 06/02/2018 06/13/2016  Orientation to time 5 5  Orientation to Place 5 5  Registration 3 3  Attention/ Calculation 0 0  Recall 2 3  Recall-comments unable to recall 1 of 3 words -  Language- name 2 objects 0 0  Language- repeat 1 1  Language- follow 3 step command 3 3  Language- read & follow direction 0 0  Write a sentence 0 0  Copy design 0 0  Total score 19 20     PLEASE NOTE: A Mini-Cog  screen was completed. Maximum score is 20. A value of 0 denotes this part of Folstein MMSE was not completed or the patient failed this part of the Mini-Cog screening.   Mini-Cog Screening Orientation to Time - Max 5 pts Orientation to Place - Max 5 pts Registration - Max 3 pts Recall - Max 3 pts Language Repeat - Max 1 pts Language Follow 3 Step Command - Max 3 pts     Immunization History  Administered Date(s) Administered  . Influenza Split 07/03/2012, 06/25/2013  . Influenza-Unspecified 06/17/2014, 05/07/2016, 06/09/2017  . Pneumococcal Conjugate-13 05/07/2016  . Pneumococcal Polysaccharide-23 10/29/2013  . Td 10/11/1999  . Tdap 03/29/2011    Screening Tests Health Maintenance  Topic Date Due  . INFLUENZA VACCINE  12/23/2018 (Originally 04/23/2018)  . MAMMOGRAM  04/16/2020  . TETANUS/TDAP  03/28/2021  . Fecal DNA (Cologuard)  03/31/2021  . DEXA SCAN  Completed  . Hepatitis C Screening  Completed  . PNA vac Low Risk Adult  Completed      Plan:     I have personally reviewed, addressed, and noted the following in the patient's chart:  A. Medical and social history B. Use of alcohol, tobacco or illicit drugs  C. Current medications and supplements D. Functional ability and status E.  Nutritional status F.  Physical activity G. Advance directives H. List of other physicians I.  Hospitalizations, surgeries, and ER visits in  previous 12 months J.  Vitals K. Screenings to include hearing, vision, cognitive, depression L. Referrals and appointments - none  In addition, I have reviewed and discussed with patient certain preventive protocols, quality metrics, and best practice recommendations. A written personalized care plan for preventive services as well as general preventive health recommendations were provided to patient.  See attached scanned questionnaire for additional information.   Signed,   Lindell Noe, MHA, BS, LPN Health Coach

## 2018-06-02 NOTE — Progress Notes (Signed)
PCP notes:   Health maintenance:  Flu vaccine - addresed  Abnormal screenings:   Hearing - failed  Hearing Screening   125Hz  250Hz  500Hz  1000Hz  2000Hz  3000Hz  4000Hz  6000Hz  8000Hz   Right ear:   40 40 40  0    Left ear:   0 0 0  0    Comments: Candidate for hearing aid in left ear - unable to wear  Mini-Cog score: 19/20 MMSE - Mini Mental State Exam 06/02/2018 06/13/2016  Orientation to time 5 5  Orientation to Place 5 5  Registration 3 3  Attention/ Calculation 0 0  Recall 2 3  Recall-comments unable to recall 1 of 3 words -  Language- name 2 objects 0 0  Language- repeat 1 1  Language- follow 3 step command 3 3  Language- read & follow direction 0 0  Write a sentence 0 0  Copy design 0 0  Total score 19 20    Patient concerns:   None  Nurse concerns:  None  Next PCP appt:   06/11/18 @ 1515  I reviewed health advisor's note, was available for consultation on the day of service listed in this note, and agree with documentation and plan. Crawford Givens, MD.

## 2018-06-02 NOTE — Patient Instructions (Signed)
Rebecca Moran , Thank you for taking time to come for your Medicare Wellness Visit. I appreciate your ongoing commitment to your health goals. Please review the following plan we discussed and let me know if I can assist you in the future.   These are the goals we discussed: Goals    . Increase physical activity     Starting 06/02/2018, I will continue to walk for 10-20 minutes daily.        This is a list of the screening recommended for you and due dates:  Health Maintenance  Topic Date Due  . Flu Shot  12/23/2018*  . Mammogram  04/16/2020  . Tetanus Vaccine  03/28/2021  . Cologuard (Stool DNA test)  03/31/2021  . DEXA scan (bone density measurement)  Completed  .  Hepatitis C: One time screening is recommended by Center for Disease Control  (CDC) for  adults born from 50 through 1965.   Completed  . Pneumonia vaccines  Completed  *Topic was postponed. The date shown is not the original due date.   Preventive Care for Adults  A healthy lifestyle and preventive care can promote health and wellness. Preventive health guidelines for adults include the following key practices.  . A routine yearly physical is a good way to check with your health care provider about your health and preventive screening. It is a chance to share any concerns and updates on your health and to receive a thorough exam.  . Visit your dentist for a routine exam and preventive care every 6 months. Brush your teeth twice a day and floss once a day. Good oral hygiene prevents tooth decay and gum disease.  . The frequency of eye exams is based on your age, health, family medical history, use  of contact lenses, and other factors. Follow your health care provider's recommendations for frequency of eye exams.  . Eat a healthy diet. Foods like vegetables, fruits, whole grains, low-fat dairy products, and lean protein foods contain the nutrients you need without too many calories. Decrease your intake of foods high  in solid fats, added sugars, and salt. Eat the right amount of calories for you. Get information about a proper diet from your health care provider, if necessary.  . Regular physical exercise is one of the most important things you can do for your health. Most adults should get at least 150 minutes of moderate-intensity exercise (any activity that increases your heart rate and causes you to sweat) each week. In addition, most adults need muscle-strengthening exercises on 2 or more days a week.  Silver Sneakers may be a benefit available to you. To determine eligibility, you may visit the website: www.silversneakers.com or contact program at 484-192-1350 Mon-Fri between 8AM-8PM.   . Maintain a healthy weight. The body mass index (BMI) is a screening tool to identify possible weight problems. It provides an estimate of body fat based on height and weight. Your health care provider can find your BMI and can help you achieve or maintain a healthy weight.   For adults 20 years and older: ? A BMI below 18.5 is considered underweight. ? A BMI of 18.5 to 24.9 is normal. ? A BMI of 25 to 29.9 is considered overweight. ? A BMI of 30 and above is considered obese.   . Maintain normal blood lipids and cholesterol levels by exercising and minimizing your intake of saturated fat. Eat a balanced diet with plenty of fruit and vegetables. Blood tests for lipids and cholesterol  should begin at age 30 and be repeated every 5 years. If your lipid or cholesterol levels are high, you are over 50, or you are at high risk for heart disease, you may need your cholesterol levels checked more frequently. Ongoing high lipid and cholesterol levels should be treated with medicines if diet and exercise are not working.  . If you smoke, find out from your health care provider how to quit. If you do not use tobacco, please do not start.  . If you choose to drink alcohol, please do not consume more than 2 drinks per day. One  drink is considered to be 12 ounces (355 mL) of beer, 5 ounces (148 mL) of wine, or 1.5 ounces (44 mL) of liquor.  . If you are 37-31 years old, ask your health care provider if you should take aspirin to prevent strokes.  . Use sunscreen. Apply sunscreen liberally and repeatedly throughout the day. You should seek shade when your shadow is shorter than you. Protect yourself by wearing long sleeves, pants, a wide-brimmed hat, and sunglasses year round, whenever you are outdoors.  . Once a month, do a whole body skin exam, using a mirror to look at the skin on your back. Tell your health care provider of new moles, moles that have irregular borders, moles that are larger than a pencil eraser, or moles that have changed in shape or color.

## 2018-06-11 ENCOUNTER — Encounter: Payer: Self-pay | Admitting: Family Medicine

## 2018-06-11 ENCOUNTER — Ambulatory Visit (INDEPENDENT_AMBULATORY_CARE_PROVIDER_SITE_OTHER): Payer: Medicare Other | Admitting: Family Medicine

## 2018-06-11 VITALS — BP 142/82 | HR 73 | Temp 98.1°F | Wt 195.8 lb

## 2018-06-11 DIAGNOSIS — R42 Dizziness and giddiness: Secondary | ICD-10-CM

## 2018-06-11 DIAGNOSIS — R1310 Dysphagia, unspecified: Secondary | ICD-10-CM

## 2018-06-11 DIAGNOSIS — E7800 Pure hypercholesterolemia, unspecified: Secondary | ICD-10-CM | POA: Diagnosis not present

## 2018-06-11 DIAGNOSIS — G43909 Migraine, unspecified, not intractable, without status migrainosus: Secondary | ICD-10-CM | POA: Diagnosis not present

## 2018-06-11 DIAGNOSIS — H9190 Unspecified hearing loss, unspecified ear: Secondary | ICD-10-CM | POA: Diagnosis not present

## 2018-06-11 DIAGNOSIS — Z0000 Encounter for general adult medical examination without abnormal findings: Secondary | ICD-10-CM

## 2018-06-11 DIAGNOSIS — Z7189 Other specified counseling: Secondary | ICD-10-CM

## 2018-06-11 MED ORDER — CHOLECALCIFEROL 25 MCG (1000 UT) PO CAPS: 1000.0000 [IU] | ORAL_CAPSULE | Freq: Every day | ORAL | Status: DC

## 2018-06-11 NOTE — Patient Instructions (Signed)
Cut back on the vitamin D in the meantime.   Update me as needed.  Take care.  Glad to see you.  Keep working on diet and exercise.

## 2018-06-11 NOTE — Progress Notes (Signed)
Hearing screen failed, d/w pt.  She has L ear loss at baseline.  Declined hearing aids at this point.  She has some trouble with background noise at baseline but is putting up with that.  Discussed hearing protection for R ear.    Recall rechecked today, normal today.  No noted memory lapses.  D/w pt.   She has rare migraines now, last sx about 5 months ago when her brother died.   Her sister has cancer.  D/w pt.  Condolences offered.    Flu vaccine - done 2019 PNA up to date.   Shingles - d/w pt.   Tetanus 2012 Hep C screening - negative, d/w pt.  cologuard 2019 Mammogram 2019 DXA declined for now.  she has prev intolerance to fosamax. Still on vit D. Advance directive- would have husband designated if she were incapacitated. If husband were incapacitated, then would have daughters Kathe BectonRobbin and French Anaracy equally designated.    H/o dysphagia with prev EGD done and she is improved after dilation.  No dysphagia now.  Still on PPI.    Elevated Cholesterol: Using medications without problems: yes Muscle aches: no Diet compliance: encouraged Exercise: encouraged Labs d/w pt.    She had an episode working in the yard for about 2 hours, after not eating or drinking anything for breakfast.  She felt sweaty and weak, sat down and the episode passed.  Then she got something to drink.  No syncope.  No CP.    PMH and SH reviewed  ROS: Per HPI unless specifically indicated in ROS section   Meds, vitals, and allergies reviewed.   GEN: nad, alert and oriented HEENT: mucous membranes moist NECK: supple w/o LA CV: rrr PULM: ctab, no inc wob ABD: soft, +bs EXT: no edema SKIN: no acute rash

## 2018-06-12 ENCOUNTER — Encounter: Payer: Self-pay | Admitting: Family Medicine

## 2018-06-12 DIAGNOSIS — R42 Dizziness and giddiness: Secondary | ICD-10-CM | POA: Insufficient documentation

## 2018-06-12 NOTE — Assessment & Plan Note (Signed)
Flu vaccine - done 2019 PNA up to date.   Shingles - d/w pt.   Tetanus 2012 Hep C screening - negative, d/w pt.  cologuard 2019 Mammogram 2019 DXA declined for now.  she has prev intolerance to fosamax. Still on vit D. Advance directive- would have husband designated if she were incapacitated. If husband were incapacitated, then would have daughters Kathe BectonRobbin and French Anaracy equally designated.

## 2018-06-12 NOTE — Assessment & Plan Note (Signed)
Declined hearing aids at this point.  Discussed.

## 2018-06-12 NOTE — Assessment & Plan Note (Signed)
H/o dysphagia with prev EGD done and she is improved after dilation.  No dysphagia now.  Still on PPI.

## 2018-06-12 NOTE — Assessment & Plan Note (Signed)
Overall doing well.  Continue as is.  Update me if more symptoms.  She agrees.

## 2018-06-12 NOTE — Assessment & Plan Note (Signed)
Advance directive- would have husband designated if she were incapacitated. If husband were incapacitated, then would have daughters Robbin and Tracy equally designated.   

## 2018-06-12 NOTE — Assessment & Plan Note (Signed)
She had an episode working in the yard for about 2 hours, after not eating or drinking anything for breakfast.  She felt sweaty and weak, sat down and the episode passed.  Then she got something to drink.  No syncope.  No CP.  Discussed with patient about routine cautions.  It sounds like she either could have had relative hypoglycemia or relative hypotension that was exacerbated by lack of adequate fluid and food intake.  Routine cautions discussed with patient.  No symptoms now.  She will update me as needed.  She agrees.

## 2018-06-12 NOTE — Assessment & Plan Note (Addendum)
Able to tolerate statin.  Continue as is.  Continue work on diet and exercise.  Labs discussed with patient.  She agrees. >25 minutes spent in face to face time with patient, >50% spent in counselling or coordination of care, discussing lipids, migraines, dysphagia, lightheadedness, etc.

## 2018-08-04 ENCOUNTER — Emergency Department: Payer: Medicare Other

## 2018-08-04 ENCOUNTER — Emergency Department
Admission: EM | Admit: 2018-08-04 | Discharge: 2018-08-04 | Disposition: A | Discharge status: Home / Self Care | Payer: Medicare Other | Attending: Emergency Medicine | Admitting: Emergency Medicine

## 2018-08-04 ENCOUNTER — Other Ambulatory Visit: Payer: Self-pay

## 2018-08-04 ENCOUNTER — Encounter: Payer: Self-pay | Admitting: Emergency Medicine

## 2018-08-04 DIAGNOSIS — S0181XA Laceration without foreign body of other part of head, initial encounter: Secondary | ICD-10-CM | POA: Diagnosis not present

## 2018-08-04 DIAGNOSIS — Y92009 Unspecified place in unspecified non-institutional (private) residence as the place of occurrence of the external cause: Secondary | ICD-10-CM | POA: Diagnosis not present

## 2018-08-04 DIAGNOSIS — Z87891 Personal history of nicotine dependence: Secondary | ICD-10-CM | POA: Insufficient documentation

## 2018-08-04 DIAGNOSIS — W01190A Fall on same level from slipping, tripping and stumbling with subsequent striking against furniture, initial encounter: Secondary | ICD-10-CM | POA: Insufficient documentation

## 2018-08-04 DIAGNOSIS — Y998 Other external cause status: Secondary | ICD-10-CM | POA: Diagnosis not present

## 2018-08-04 DIAGNOSIS — I10 Essential (primary) hypertension: Secondary | ICD-10-CM | POA: Insufficient documentation

## 2018-08-04 DIAGNOSIS — E785 Hyperlipidemia, unspecified: Secondary | ICD-10-CM | POA: Insufficient documentation

## 2018-08-04 DIAGNOSIS — Z23 Encounter for immunization: Secondary | ICD-10-CM | POA: Insufficient documentation

## 2018-08-04 DIAGNOSIS — Y9389 Activity, other specified: Secondary | ICD-10-CM | POA: Insufficient documentation

## 2018-08-04 DIAGNOSIS — S0990XA Unspecified injury of head, initial encounter: Secondary | ICD-10-CM | POA: Insufficient documentation

## 2018-08-04 DIAGNOSIS — S199XXA Unspecified injury of neck, initial encounter: Secondary | ICD-10-CM | POA: Diagnosis not present

## 2018-08-04 DIAGNOSIS — Z79899 Other long term (current) drug therapy: Secondary | ICD-10-CM | POA: Diagnosis not present

## 2018-08-04 MED ORDER — TETANUS-DIPHTH-ACELL PERTUSSIS 5-2.5-18.5 LF-MCG/0.5 IM SUSP
0.5000 mL | Freq: Once | INTRAMUSCULAR | Status: AC
  Administered 2018-08-04: 0.5 mL via INTRAMUSCULAR
  Filled 2018-08-04: qty 0.5

## 2018-08-04 MED ORDER — CEPHALEXIN 500 MG PO CAPS
500.0000 mg | ORAL_CAPSULE | Freq: Once | ORAL | Status: AC
  Administered 2018-08-04: 500 mg via ORAL
  Filled 2018-08-04: qty 1

## 2018-08-04 MED ORDER — LIDOCAINE HCL (PF) 1 % IJ SOLN
10.0000 mL | Freq: Once | INTRAMUSCULAR | Status: AC
  Administered 2018-08-04: 10 mL
  Filled 2018-08-04: qty 10

## 2018-08-04 MED ORDER — CEPHALEXIN 500 MG PO CAPS: 500.0000 mg | ORAL_CAPSULE | Freq: Three times a day (TID) | ORAL | 0 refills | Status: AC

## 2018-08-04 NOTE — Discharge Instructions (Addendum)
You have been seen in the Emergency Department (ED) today for a head injury and forehead laceration.  Fortunately your CT scans did not show any evidence of acute injury such as bleeding in your brain or fractures.    Please keep the cut clean but do not submerge it in the water.  It has been repaired with sutures that will need to be removed in about 7 days.    Please follow up with your doctor, an urgent care, or return to the ED for suture removal.  Let your doctor know that there are 10 sutures on the outside the need to be removed and there was one suture on the inside that will be reabsorbed by your body.  Because of the depth and complexity of the wound, I have prescribed you a short course of antibiotics for you to take to try to prevent wound infection.  Please take the full course of treatment (only for the next few days).  Remember that using ice packs over your forehead and left eye may help reduce the swelling.  Please take Tylenol (acetaminophen) or Motrin (ibuprofen) as needed for discomfort as written on the box.   Please follow up with your doctor as soon as possible regarding today's emergent visit.  Of note, your blood pressure was very high today, but as we discussed, it is more likely high due to everything you have gone through this morning including the pain of your injury rather than a new or different problem.  We recommend you follow-up with your regular doctor to have your blood pressure rechecked to determine if you need to be on any blood pressure medicine, but generally this is not something that we start people on in the emergency department, particularly when they have an acute injury.  We discussed doing additional work-up but you would prefer not to do that at this time, and I believe that is reasonable.  Return to the ED or call your doctor if you notice any signs of infection such as fever, increased pain, increased redness, pus, or other symptoms that concern you.

## 2018-08-04 NOTE — ED Provider Notes (Signed)
[facility] Emergency Department Provider Note  ____________________________________________   First MD Initiated Contact with Patient 08/04/18 [phone]     (approximate)  I have reviewed the triage vital signs and the nursing notes.   HISTORY  Chief Complaint Fall    HPI Rebecca Moran is a 72 y.o. female who presents by private vehicle for evaluation of a head injury after a fall.  She states she was asleep and does not remember getting up but she remembers falling and striking her head on a wooden shelf.  She states, and her adult daughter confirms, that she frequently acts out in her sleep, thrashing around violently, and this type of behavior and fall is not unexpected.  She has some mild frontal headache but a large laceration to the left side of her forehead extending down into her left eyebrow.  She denies any other pain or injury.  She specifically denies visual changes, neck pain, chest pain, shortness of breath, nausea, vomiting, and abdominal pain.  Nothing in particular makes her symptoms better or worse and she describes the fall and laceration as severe but the headache currently is mild.  She is in good spirits, laughing and joking.  She denies having history of hypertension and says she does not take any medication for it but she has been seen in the past for severely elevated hypertension that seem to resolve on its own.  Past Medical History:  Diagnosis Date  . Allergy    seasonal  . Family history of cancer   . Headache   . Hearing loss    in L ear  . History of migraines    MRI -head,secondary H?A L_OCCIP within normal limits 07/1999//CT of head without Normal ([facility]) 12/21/2007  . Hyperlipidemia   . NSVD (normal spontaneous vaginal delivery)    x 2  . Osteoporosis     Patient Active Problem List   Diagnosis Date Noted  . Lightheaded 06/12/2018  . Chest pain 10/18/2017  . Healthcare maintenance 06/25/2016  . Dysphagia  06/25/2016  . Advance care planning 12/12/2014  . Knee pain 10/31/2013  . Osteoporosis 07/22/2012  . Hard of hearing 07/05/2012  . Medicare annual wellness visit, subsequent 07/05/2012  . Hypercholesteremia 03/29/2011  . Elevated BP without diagnosis of hypertension 12/21/2007  . ALLERGIC RHINITIS, SEASONAL 03/24/2007  . Migraine 03/24/2007    Past Surgical History:  Procedure Laterality Date  . ABDOMINAL HYSTERECTOMY  11/99   partial hysterectomy ovaries intact (fibroids  . COLONOSCOPY WITH PROPOFOL N/A 01/13/2018   Procedure: COLONOSCOPY WITH PROPOFOL;  Surgeon: Christena DeemSkulskie, Martin U, MD;  [facility] END[facility]COPY;  Service: Endoscopy;  Laterality: N/A;  . ESOPHAGOGASTRODUODENOSCOPY (EGD) WITH PROPOFOL N/A 01/13/2018   Procedure: ESOPHAGOGASTRODUODENOSCOPY (EGD) WITH PROPOFOL;  Surgeon: Christena DeemSkulskie, Martin U, MD;  [facility];  Service: Endoscopy;  Laterality: N/A;  . [facility]    . stress cardiolite  04/28/2001   stress cardiolite wnl, EF 69%  . TUBAL LIGATION  1969    Prior to Admission medications   Medication Sig Start Date End Date Taking? Authorizing Provider  cephALEXin (KEFLEX) 500 MG capsule Take 1 capsule (500 mg total) by mouth 3 (three) times daily for 5 days. 08/04/18 08/09/18  Loleta RoseForbach, Taniqua Issa, MD  cetirizine (ZYRTEC) 10 MG tablet Take 10 mg by mouth daily.      [provider]  Cholecalciferol ([employer] VITAMIN D3) 1000 units capsule Take 1 capsule (1,000 Units total) by mouth daily. 06/11/18  Joaquim Nam, MD  fluticasone [facility]) 50 MCG/ACT nasal spray Place 2 sprays into both nostrils as needed.     [provider]  pantoprazole (PROTONIX) 40 MG tablet Take 1 tablet (40 mg total) by mouth daily. 05/27/18   Joaquim Nam, MD  pravastatin (PRAVACHOL) 40 MG tablet Take 1 tablet (40 mg total) by mouth daily. 02/20/18   Joaquim Nam, MD  rizatriptan (MAXALT-MLT) 10 MG disintegrating tablet Take 1 tablet  (10 mg total) by mouth as needed. May repeat in 2 hours if needed 10/24/17   Joaquim Nam, MD    Allergies Crestor [rosuvastatin calcium]; Fosamax [alendronate sodium]; Oxycodone-acetaminophen; Prednisone; and Sumatriptan  Family History  Problem Relation Age of Onset  . Heart disease Mother 61       CHF  . Cancer Mother 1       lung cancer since her 8's with kidney cancer //cancer of the throat and tongue //never smoked or drank  . Diabetes Mother        borderline  . Cancer Father 58       melanoma  . Cancer Sister        breast cancer and ovarian  . Breast cancer Sister   . Ovarian cancer Sister   . Bone cancer Sister   . Heart disease Paternal Grandfather        MI  . Diabetes Brother   . Stroke Maternal Grandmother   . Depression Maternal Grandfather   . Colon cancer Neg Hx     Social History Social History   Tobacco Use  . Smoking status: Former Smoker    Packs/day: 2.00    Years: 22.00    Pack years: 44.00    Last attempt to quit: 09/23/1981    Years since quitting: 36.8  . Smokeless tobacco: Never Used  . Tobacco comment: quit in 1975  Substance Use Topics  . Alcohol use: No  . Drug use: No    Review of Systems Constitutional: No fever/chills Eyes: No visual changes. ENT: No sore throat. Cardiovascular: Denies chest pain. Respiratory: Denies shortness of breath. Gastrointestinal: No abdominal pain.  No nausea, no vomiting.  No diarrhea.  No constipation. Genitourinary: Negative for dysuria. Musculoskeletal: Left forehead injury after fall.  Negative for neck pain.  Negative for back pain. Integumentary: Laceration to the left side of forehead Neurological: Negative for headaches, focal weakness or numbness.   ____________________________________________   PHYSICAL EXAM:  VITAL SIGNS: ED Triage Vitals  Enc Vitals Group     BP 08/04/18 0516 (!) 157/95     Pulse Rate 08/04/18 0516 78     Resp 08/04/18 0516 16     Temp 08/04/18 0516 (!) 97.5  F (36.4 C)     Temp Source 08/04/18 0516 Oral     SpO2 08/04/18 0516 97 %     Weight 08/04/18 0517 88.9 kg (196 lb)     Height 08/04/18 0517 1.651 m (5\' 5" )     Head Circumference --      Peak Flow --      Pain Score 08/04/18 0517 10     Pain Loc --      Pain Edu? --      Excl. in GC? --     Constitutional: Alert and oriented.  No acute distress in spite of obvious head injury.   Eyes: Conjunctivae are normal. PERRL. EOMI. Head: Laceration to left forehead with some surrounding ecchymosis and hematoma.  See skin exam  for details. Nose: No epistaxis Mouth/Throat: Mucous membranes are moist. Neck: No stridor.  No meningeal signs.  No cervical spine tenderness to palpation. Cardiovascular: Normal rate, regular rhythm. Good peripheral circulation. Grossly normal heart sounds. Respiratory: Normal respiratory effort.  No retractions. Lungs CTAB. Gastrointestinal: Soft and nontender. No distention.  Musculoskeletal: No lower extremity tenderness nor edema. No gross deformities of extremities.  Normal range of motion and no evidence of injuries to her hands or arms. Neurologic: GCS 15.  Normal speech and language. No gross focal neurologic deficits are appreciated.  Skin: The patient has a V-shaped laceration with a flap to the left side of her forehead that extends most of the height of the forehead and down into her left eyebrow.  It is 5 cm long in the longest linear dimension and variously measuring 3 and 4 cm at the other edges.  The laceration extends through the dermis but does not appear to extend through the muscle layer covering the skull.   ____________________________________________   LABS (all labs ordered are listed, but only abnormal results are displayed)  Labs Reviewed - No data to display ____________________________________________  EKG  None - EKG not ordered by ED physician ____________________________________________  RADIOLOGY   ED MD interpretation: No  indication of acute injury other than the scalp laceration on the CT head and CT cervical spine.  Official radiology report(s): Ct Head Wo Contrast  Result Date: 08/04/2018 CLINICAL DATA:  Status post fall. Hit head, with concern for head or cervical spine injury. Initial encounter. EXAM: CT HEAD WITHOUT CONTRAST CT CERVICAL SPINE WITHOUT CONTRAST TECHNIQUE: Multidetector CT imaging of the head and cervical spine was performed following the standard protocol without intravenous contrast. Multiplanar CT image reconstructions of the cervical spine were also generated. COMPARISON:  CT of the head performed 12/21/2007, and MRI of the brain performed 01/19/2008 FINDINGS: CT HEAD FINDINGS Brain: No evidence of acute infarction, hemorrhage, hydrocephalus, extra-axial collection or mass lesion / mass effect. Prominence of the sulci suggests mild cortical volume loss. Mild cerebellar atrophy is noted. Mild subcortical white matter change likely reflects small vessel ischemic microangiopathy. The brainstem and fourth ventricle are within normal limits. The basal ganglia are unremarkable in appearance. The cerebral hemispheres demonstrate grossly normal gray-white differentiation. No mass effect or midline shift is seen. Vascular: No hyperdense vessel or unexpected calcification. Skull: No free fluid is identified within the pelvis. The colon is unremarkable in appearance; the appendix is normal in caliber, and contains air. Sinuses/Orbits: The visualized portions of the orbits are within normal limits. The paranasal sinuses and mastoid air cells are well-aerated. Other: A soft tissue laceration is noted overlying the left frontal calvarium. CT CERVICAL SPINE FINDINGS Alignment: Normal. Skull base and vertebrae: No acute fracture. No primary bone lesion or focal pathologic process. Soft tissues and spinal canal: No prevertebral fluid or swelling. No visible canal hematoma. Disc levels: Minimal intervertebral disc space  narrowing is noted along the lower cervical spine, with scattered anterior and posterior disc osteophyte complexes. Upper chest: The visualized lung apices are clear. The thyroid gland is unremarkable in appearance. Mild calcification is noted at the carotid bifurcations bilaterally. Other: No additional soft tissue abnormalities are seen. IMPRESSION: 1. No evidence of traumatic intracranial injury or fracture. 2. No evidence of fracture or subluxation along the cervical spine. 3. Soft tissue laceration overlying the left frontal calvarium. 4. Mild cortical volume loss and scattered small vessel ischemic microangiopathy. 5. Mild calcification at the carotid bifurcations bilaterally. Carotid ultrasound could  be considered for further evaluation, when and as deemed clinically appropriate. Electronically Signed   By: Roanna Raider M.D.   On: 08/04/2018 05:57   Ct Cervical Spine Wo Contrast  Result Date: 08/04/2018 CLINICAL DATA:  Status post fall. Hit head, with concern for head or cervical spine injury. Initial encounter. EXAM: CT HEAD WITHOUT CONTRAST CT CERVICAL SPINE WITHOUT CONTRAST TECHNIQUE: Multidetector CT imaging of the head and cervical spine was performed following the standard protocol without intravenous contrast. Multiplanar CT image reconstructions of the cervical spine were also generated. COMPARISON:  CT of the head performed 12/21/2007, and MRI of the brain performed 01/19/2008 FINDINGS: CT HEAD FINDINGS Brain: No evidence of acute infarction, hemorrhage, hydrocephalus, extra-axial collection or mass lesion / mass effect. Prominence of the sulci suggests mild cortical volume loss. Mild cerebellar atrophy is noted. Mild subcortical white matter change likely reflects small vessel ischemic microangiopathy. The brainstem and fourth ventricle are within normal limits. The basal ganglia are unremarkable in appearance. The cerebral hemispheres demonstrate grossly normal gray-white differentiation. No  mass effect or midline shift is seen. Vascular: No hyperdense vessel or unexpected calcification. Skull: No free fluid is identified within the pelvis. The colon is unremarkable in appearance; the appendix is normal in caliber, and contains air. Sinuses/Orbits: The visualized portions of the orbits are within normal limits. The paranasal sinuses and mastoid air cells are well-aerated. Other: A soft tissue laceration is noted overlying the left frontal calvarium. CT CERVICAL SPINE FINDINGS Alignment: Normal. Skull base and vertebrae: No acute fracture. No primary bone lesion or focal pathologic process. Soft tissues and spinal canal: No prevertebral fluid or swelling. No visible canal hematoma. Disc levels: Minimal intervertebral disc space narrowing is noted along the lower cervical spine, with scattered anterior and posterior disc osteophyte complexes. Upper chest: The visualized lung apices are clear. The thyroid gland is unremarkable in appearance. Mild calcification is noted at the carotid bifurcations bilaterally. Other: No additional soft tissue abnormalities are seen. IMPRESSION: 1. No evidence of traumatic intracranial injury or fracture. 2. No evidence of fracture or subluxation along the cervical spine. 3. Soft tissue laceration overlying the left frontal calvarium. 4. Mild cortical volume loss and scattered small vessel ischemic microangiopathy. 5. Mild calcification at the carotid bifurcations bilaterally. Carotid ultrasound could be considered for further evaluation, when and as deemed clinically appropriate. Electronically Signed   By: Roanna Raider M.D.   On: 08/04/2018 05:57    ____________________________________________   PROCEDURES  Critical Care performed: No   Procedure(s) performed:   Marland KitchenMarland KitchenLaceration Repair Date/Time: 08/04/2018 8:10 AM Performed by: Loleta Rose, MD Authorized by: Loleta Rose, MD   Consent:    Consent obtained:  Verbal   Consent given by:  Patient   Risks  discussed:  Infection, pain, retained foreign body, poor cosmetic result and poor wound healing Anesthesia (see MAR for exact dosages):    Anesthesia method:  Local infiltration   Local anesthetic:  Lidocaine 1% w/o epi Laceration details:    Location:  Face   Face location:  Forehead   Wound length (cm): flap (V-shaped) laceration measuring about 5-cm along the longest linear edge. Repair type:    Repair type:  Complex Pre-procedure details:    Preparation:  Imaging obtained to evaluate for foreign bodies Exploration:    Hemostasis achieved with:  Direct pressure   Wound exploration: entire depth of wound probed and visualized     Contaminated: no   Treatment:    Area cleansed with:  Saline  Amount of cleaning:  Standard   Irrigation solution:  Sterile saline   Visualized foreign bodies/material removed: no   Subcutaneous repair:    Suture size:  5-0   Wound subcutaneous closure material used: Vicryl Rapide.   Suture technique:  Horizontal mattress   Number of sutures:  1 Skin repair:    Repair method:  Sutures   Suture size:  5-0   Suture material:  Nylon   Suture technique:  Simple interrupted   Number of sutures:  10 (one of the 10 is a corner-stitch to help bring together the apex of the wound) Approximation:    Approximation:  Close Post-procedure details:    Dressing:  Sterile dressing   Patient tolerance of procedure:  Tolerated well, no immediate complications     ____________________________________________   INITIAL IMPRESSION / ASSESSMENT AND PLAN / ED COURSE  As part of my medical decision making, I reviewed the following data within the electronic MEDICAL RECORD NUMBER History obtained from family and Nursing notes reviewed and incorporated    Differential diagnosis includes, but is not limited to, mechanical fall leading to intracranial bleeding, laceration, skull fracture.  The patient thinks she is up-to-date on her tetanus but I gave her a Tdap just to  be sure given the depth of the wound.  She is in no distress and is acting appropriate, laughing and joking with me in spite of her wound.  Her CT scans were reassuring.  She is consistently hypertensive but she is not worried about this and says that it has happened in the past.  I offered to obtain some lab work but she does not want to do that I would just wants to follow-up with her regular doctor.  I think that is appropriate and I think that her hypertension is likely related to her accident and having to come into the emergency department given that she has no other signs or symptoms of hypertensive emergency.  Given the complexity and depth of her wound, I gave her a dose of Keflex 500 mg by mouth and a prescription for a short course for prophylactic treatment to try to avoid a wound infection.  I gave my usual customary sutured wound care recommendations and she will follow-up with her primary care doctor both to reevaluate her  Possible hypertension and to have her sutures removed.  I gave strict return precautions to her and her daughter and they understand and agree with the plan    ____________________________________________  FINAL CLINICAL IMPRESSION(S) / ED DIAGNOSES  Final diagnoses:  Forehead laceration, initial encounter  Minor head injury, initial encounter  Essential hypertension     MEDICATIONS GIVEN DURING THIS VISIT:  Medications  lidocaine (PF) (XYLOCAINE) 1 % injection 10 mL (10 mLs Other Given 08/04/18 0652)  Tdap (BOOSTRIX) injection 0.5 mL (0.5 mLs Intramuscular Given 08/04/18 0734)  cephALEXin (KEFLEX) capsule 500 mg (500 mg Oral Given 08/04/18 0807)     ED Discharge Orders         Ordered    cephALEXin (KEFLEX) 500 MG capsule  3 times daily     08/04/18 0745           Note:  This document was prepared using Dragon voice recognition software and may include unintentional dictation errors.    Loleta Rose, MD 08/04/18 [phone]

## 2018-08-04 NOTE — ED Triage Notes (Signed)
Pt to triage via w/c with no distress noted; st unsure what happened, was asleep and then next thing she remembered was falling hitting head on tv stand; large laceration noted to left side forehead with no active bleeding

## 2018-08-04 NOTE — ED Notes (Signed)
Pt sitting up in bed, family at bedside, pt given cup of ice, waiting for disposition. Tdap given.

## 2018-08-04 NOTE — ED Notes (Signed)
Pt states that she was in bed asleep and that she doesn't remember getting up but she thinks she got up and tripped over a box and she fell and hit her head. Family member at bedside. Pt denies LOC.

## 2018-08-05 ENCOUNTER — Telehealth: Payer: Self-pay | Admitting: Family Medicine

## 2018-08-05 NOTE — Telephone Encounter (Signed)
Please call and check on patient.  Recent emergency room visit. She did have some mild calcifications noted in the carotids.  The treatment would be to continue her pravastatin as is and make sure her blood pressure is still reasonably well controlled.  I do not think she needs a repeat carotid ultrasound at this point. The report mentioned that she had abnormal movements in her sleep.  Please get some clarity on this.  It may be reasonable to sit down to talk about this at an office visit.  Thanks.

## 2018-08-05 NOTE — Telephone Encounter (Signed)
Patient notified as instructed by telephone and verbalized understanding. Patient stated that she does not remember getting out of the bed until she fell and hit her head. Patient stated that she already has an appointment scheduled to get stitches removed and can discuss it at that appointment with Dr. Para Marchuncan.

## 2018-08-06 NOTE — Telephone Encounter (Signed)
Noted. Thanks.

## 2018-08-11 ENCOUNTER — Encounter: Payer: Self-pay | Admitting: Family Medicine

## 2018-08-11 ENCOUNTER — Ambulatory Visit (INDEPENDENT_AMBULATORY_CARE_PROVIDER_SITE_OTHER): Payer: Medicare Other | Admitting: Family Medicine

## 2018-08-11 VITALS — BP 122/70 | HR 66 | Temp 97.4°F | Ht 65.0 in | Wt 196.5 lb

## 2018-08-11 DIAGNOSIS — I6529 Occlusion and stenosis of unspecified carotid artery: Secondary | ICD-10-CM | POA: Diagnosis not present

## 2018-08-11 DIAGNOSIS — S0181XD Laceration without foreign body of other part of head, subsequent encounter: Secondary | ICD-10-CM | POA: Diagnosis not present

## 2018-08-11 NOTE — Patient Instructions (Addendum)
Keep taking the cholesterol medicine.    If you have more sleep changes then let me know.   Keep the steristrips on until the ends curl up, trim the ends as needed.   Update me as needed.   Take care.  Glad to see you.

## 2018-08-11 NOTE — Progress Notes (Signed)
ER f/u. She talks in her sleep, at baseline. She doesn't recall that usually. Others have noted it.  She was apparently sleep walking vs up and drowsy and fell, hit her head.  That was the first time she was sleepwalking vs drowsy/walking like this.  No HA since the day after the event.  No acute complaints o/w.  Normal speech and judgement today.  No photophobia.  She has L ear hearing trouble at baseline.  No typical concussion sx in the meantime.   She attributed some of the sleep changes to stressful family events of the year, with her sister dying of bone cancer.   No etoh.    She did have some mild calcifications noted in the carotids.    Discussed with patient.  The treatment would be to continue her pravastatin as is and make sure her blood pressure is still reasonably well controlled.  I do not think she needs a repeat carotid ultrasound at this point.  Discussed.  She agrees.  Meds, vitals, and allergies reviewed.   ROS: Per HPI unless specifically indicated in ROS section   nad except for L brow laceration healing Dependent bruising L orbit.  Minimal bruising on R side, likely dependent also.   PERRL EOMI OP wnl Nec supple, no LA, no bruit on carotids.  rrr ctab Gait and speech within normal limits.  Sutures removed x10 from left brow without complication.  Tissue is still well apposed.  Covered with Steri-Strips per routine.  Tolerated well.  No complication.

## 2018-08-12 DIAGNOSIS — S0181XA Laceration without foreign body of other part of head, initial encounter: Secondary | ICD-10-CM | POA: Insufficient documentation

## 2018-08-12 DIAGNOSIS — I6529 Occlusion and stenosis of unspecified carotid artery: Secondary | ICD-10-CM | POA: Insufficient documentation

## 2018-08-12 NOTE — Assessment & Plan Note (Signed)
She did have some mild calcifications noted in the carotids.    Discussed with patient.  The treatment would be to continue her pravastatin as is and make sure her blood pressure is still reasonably well controlled.  I do not think she needs a repeat carotid ultrasound at this point.  Discussed.  She agrees.

## 2018-08-12 NOTE — Assessment & Plan Note (Signed)
Sutures removed easily.  No complication.  Steri-Strips applied per routine without complication.  Routine instructions given.  She understood.  She does not have typical postconcussion symptoms at this point.  Still okay for outpatient follow-up.  We talked about her sleep habits.  Unclear if she truly was sleepwalking or not.  This seems to be an isolated event.  If she has progression or return of abnormal sleep symptoms then she can let me know.  Her husband can monitor her symptoms and update her in me if needed.  She agrees with plan.  It does not appear that she needs referral to neurology at this point.  All questions answered.  >25 minutes spent in face to face time with patient, >50% spent in counselling or coordination of care.

## 2018-11-02 ENCOUNTER — Other Ambulatory Visit: Payer: Self-pay | Admitting: *Deleted

## 2018-11-02 MED ORDER — PRAVASTATIN SODIUM 40 MG PO TABS: 40.0000 mg | ORAL_TABLET | Freq: Every day | ORAL | 3 refills | Status: DC

## 2018-11-02 MED ORDER — PANTOPRAZOLE SODIUM 40 MG PO TBEC: 40.0000 mg | DELAYED_RELEASE_TABLET | Freq: Every day | ORAL | 3 refills | Status: DC

## 2018-11-20 ENCOUNTER — Encounter: Payer: Self-pay | Admitting: Family Medicine

## 2018-11-20 ENCOUNTER — Telehealth: Payer: Self-pay

## 2018-11-20 ENCOUNTER — Ambulatory Visit (INDEPENDENT_AMBULATORY_CARE_PROVIDER_SITE_OTHER): Payer: Medicare Other | Admitting: Family Medicine

## 2018-11-20 VITALS — BP 138/86 | HR 60 | Temp 97.6°F | Ht 65.0 in | Wt 194.0 lb

## 2018-11-20 DIAGNOSIS — M26629 Arthralgia of temporomandibular joint, unspecified side: Secondary | ICD-10-CM

## 2018-11-20 DIAGNOSIS — H6990 Unspecified Eustachian tube disorder, unspecified ear: Secondary | ICD-10-CM

## 2018-11-20 DIAGNOSIS — H6980 Other specified disorders of Eustachian tube, unspecified ear: Secondary | ICD-10-CM | POA: Diagnosis not present

## 2018-11-20 NOTE — Telephone Encounter (Signed)
Pt aware of appointment today 2/28 @ 4

## 2018-11-20 NOTE — Progress Notes (Signed)
R ear pain.  Started yesterday.  She has dec hearing in L ear at baseline, that is an old issue.  No FCNAVD.  She tried using a Q tip with transient relief, then more pain thereafter.  Took advil last night. Still with pain this AM.    Meds, vitals, and allergies reviewed.   ROS: Per HPI unless specifically indicated in ROS section   nad ncat Neck supple, no LA rrr ctab Ext w/o edema.  Right ear canal with minimal irritation but no sign of infection.  It looks like she may have mildly abraded one area with a Q-tip Right ear with eustachian tube dysfunction noted.  No tympanic membrane movement on Valsalva. Right TMJ slightly tender on range of motion. Left canal and eardrum normal on exam. No significant cerumen in either canal.

## 2018-11-20 NOTE — Patient Instructions (Signed)
Gently try to pop you ears and restart flonase.  Don't chew tough meats and use ice on your jaw.   Take care.  Glad to see you.

## 2018-11-20 NOTE — Telephone Encounter (Signed)
[facility] Night - Client Nonclinical Telephone Record [facility] Client [facility] Night - Client Client Site [facility] - Night Physician Raechel Ache - MD Contact Type Call Who Is Calling Patient / Member / Family / Caregiver Caller Name Rebecca Moran Caller Phone Number [phone] Patient Name Rebecca Moran Patient DOB 1946-01-28 Call Type Message Only Information Provided Reason for Call Request to Schedule Office Appointment Initial Comment Caller states she has an earache that started late yesterday. She wants to make an appointment for today if possible. Additional Comment Please call as soon as possible. Call Closed By: Angelique Blonder Transaction Date/Time: 11/20/2018 7:52:09 AM (ET)

## 2018-11-20 NOTE — Telephone Encounter (Signed)
Can she come in today at 4PM?  If so, then open a slot and schedule.  Thanks.

## 2018-11-20 NOTE — Telephone Encounter (Signed)
Rebecca Moran already has appt with Dr Para March on 11/23/18 at 11:30. I spoke with Rebecca Moran and offered an appt at another [facility] site for today but Rebecca Moran declined and Rebecca Moran wants to see Dr Para March. I advised Rebecca Moran there is a Sat Clinic at the [facility] office that Rebecca Moran could be seen at also and if Rebecca Moran changes her mind and wants appt at [facility] office Rebecca Moran can cb. Rebecca Moran voiced understanding. FYI to Dr Para March.

## 2018-11-22 DIAGNOSIS — H6980 Other specified disorders of Eustachian tube, unspecified ear: Secondary | ICD-10-CM | POA: Insufficient documentation

## 2018-11-22 NOTE — Assessment & Plan Note (Signed)
She likely has 2 issues, eustachian tube dysfunction and also TMJ pain.  Discussed options.  Gently perform Valsalva.  Restart Flonase. She can use ice on her TMJ as needed.  Avoid chewing tough meat, etc.  Update [place] as needed.  She agrees to plan.

## 2018-11-23 ENCOUNTER — Ambulatory Visit: Payer: Self-pay | Admitting: Family Medicine

## 2019-03-11 ENCOUNTER — Other Ambulatory Visit: Payer: Self-pay | Admitting: Family Medicine

## 2019-03-11 DIAGNOSIS — Z1231 Encounter for screening mammogram for malignant neoplasm of breast: Secondary | ICD-10-CM

## 2019-04-20 ENCOUNTER — Ambulatory Visit
Admission: RE | Admit: 2019-04-20 | Discharge: 2019-04-20 | Disposition: A | Discharge status: Home / Self Care | Payer: Medicare Other | Source: Ambulatory Visit | Attending: Family Medicine | Admitting: Family Medicine

## 2019-04-20 DIAGNOSIS — Z1231 Encounter for screening mammogram for malignant neoplasm of breast: Secondary | ICD-10-CM | POA: Diagnosis not present

## 2019-04-30 IMAGING — MG MM DIGITAL SCREENING BILAT W/ TOMO W/ CAD
8 series · 8 of 24 positions shown · non-contrast
Comparison: Previous exam(s).

CLINICAL DATA: Screening.

EXAM:
DIGITAL SCREENING BILATERAL MAMMOGRAM WITH TOMO AND CAD

[L CC synth-2D]
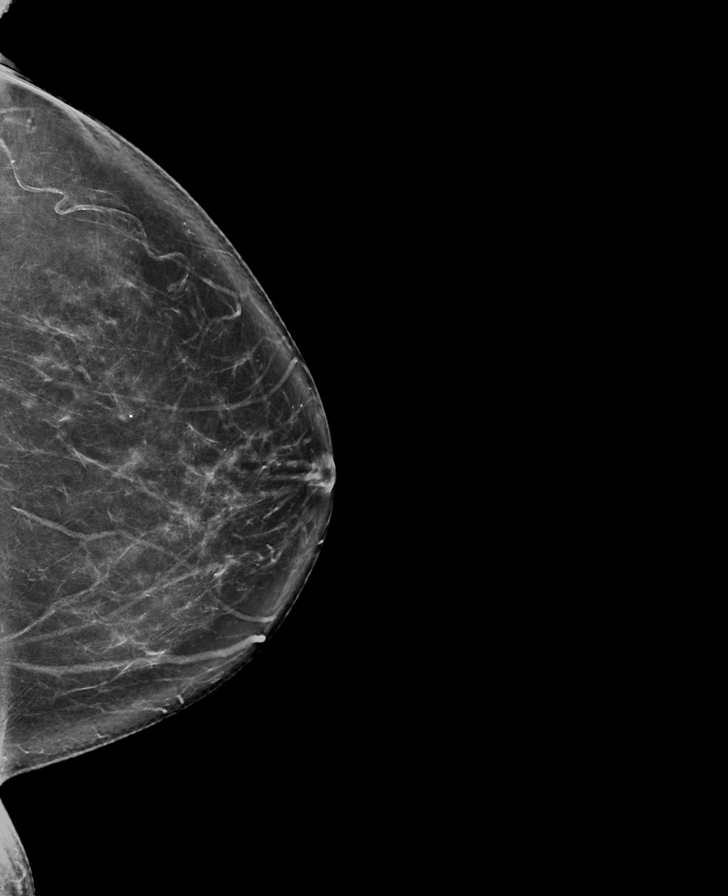

[R MLO synth-2D]
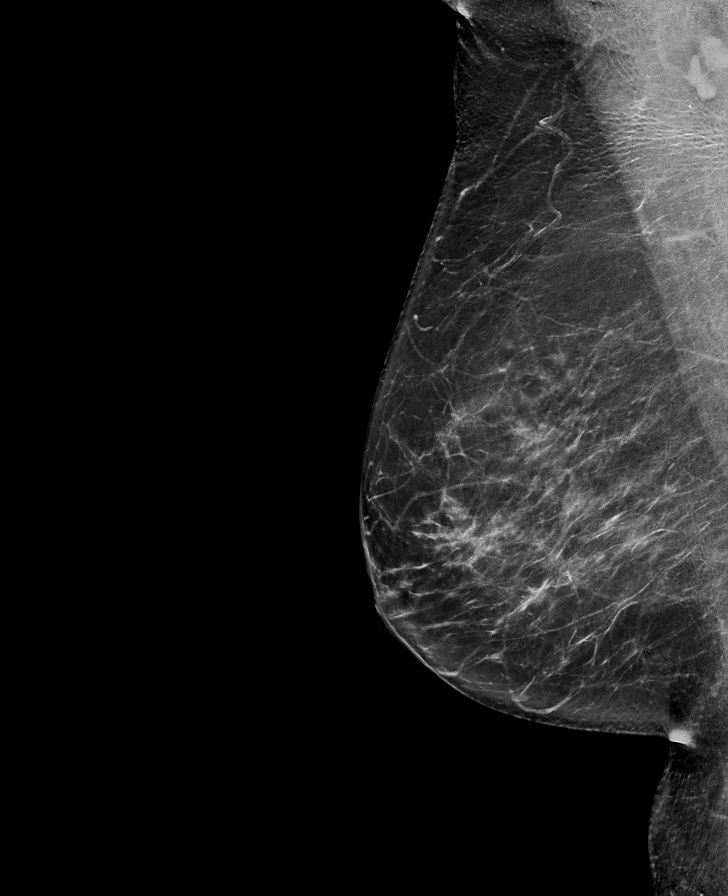

[R CC synth-2D]
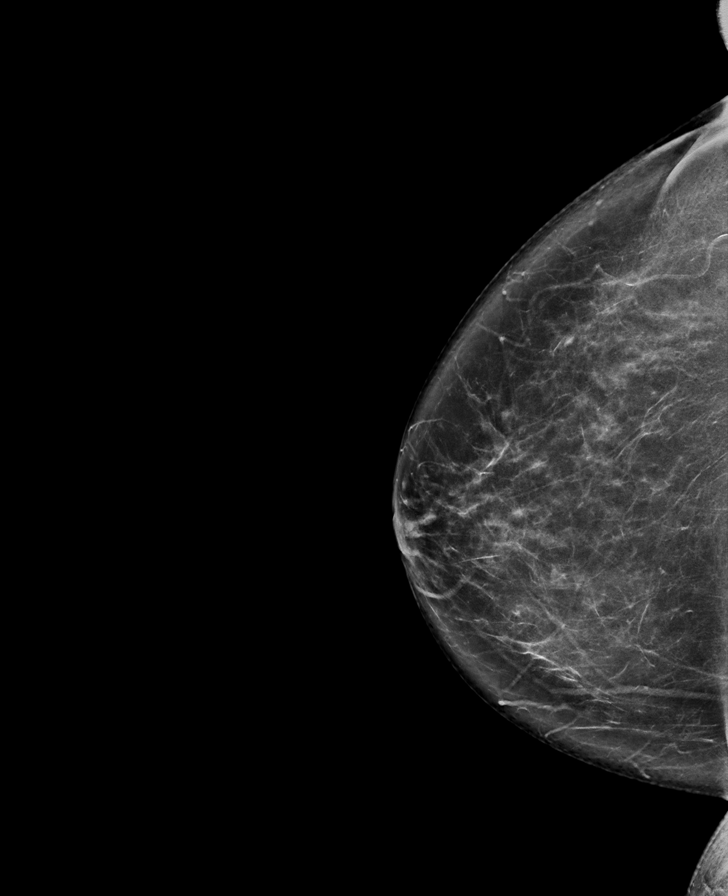

[L MLO synth-2D]
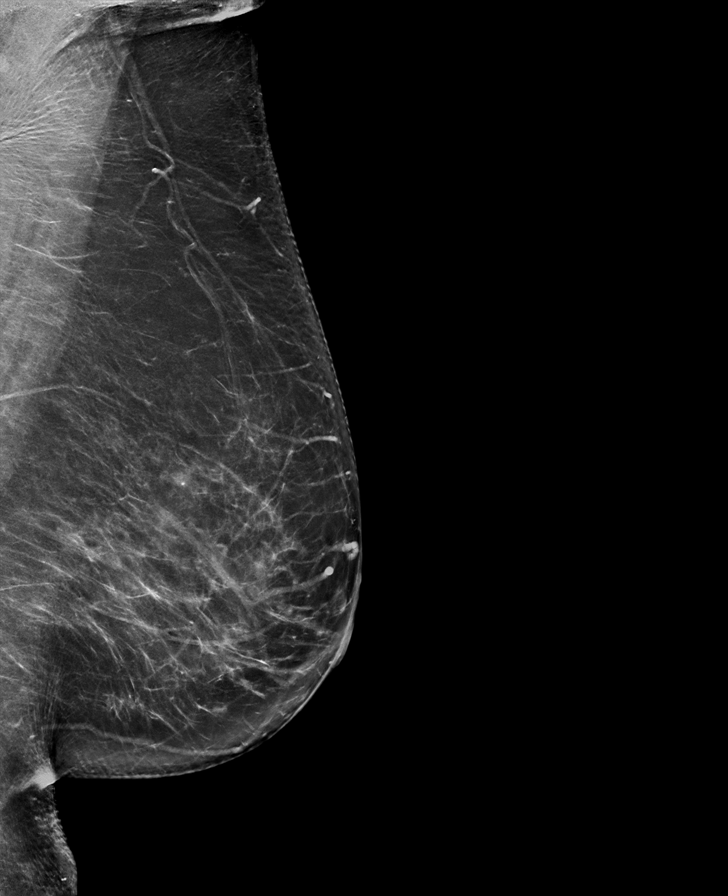

[L CC tomo · tomo slice 43/86.0]
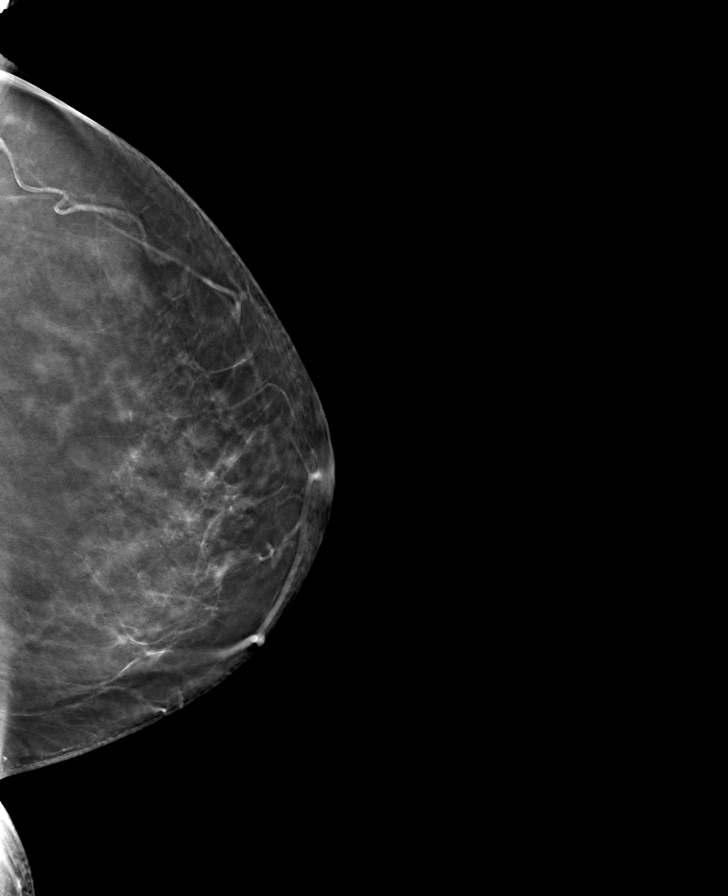

[R CC tomo · tomo slice 43/86.0]
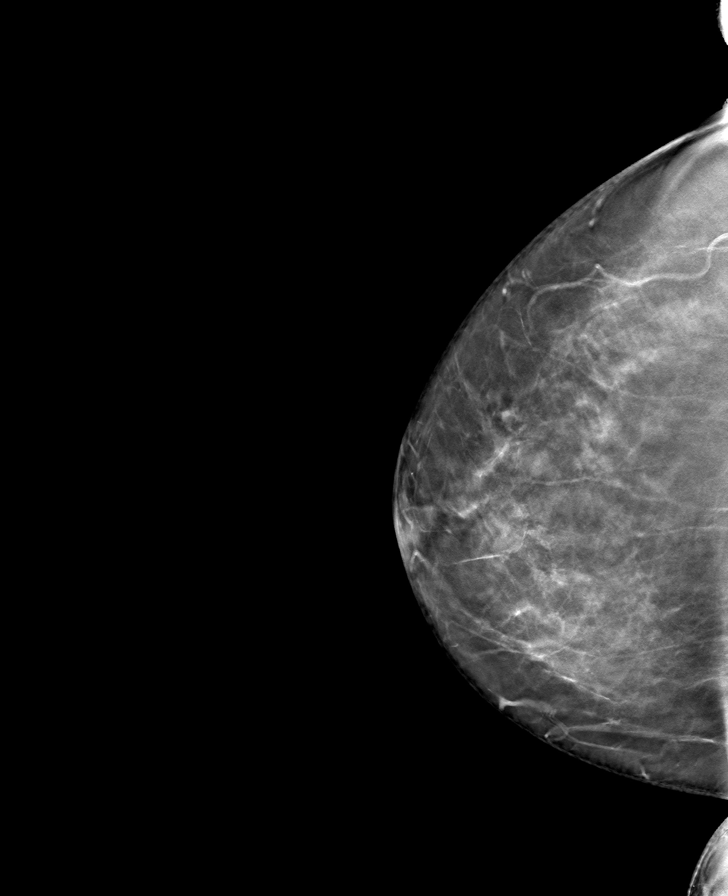

[L MLO tomo · tomo slice 43/84.0]
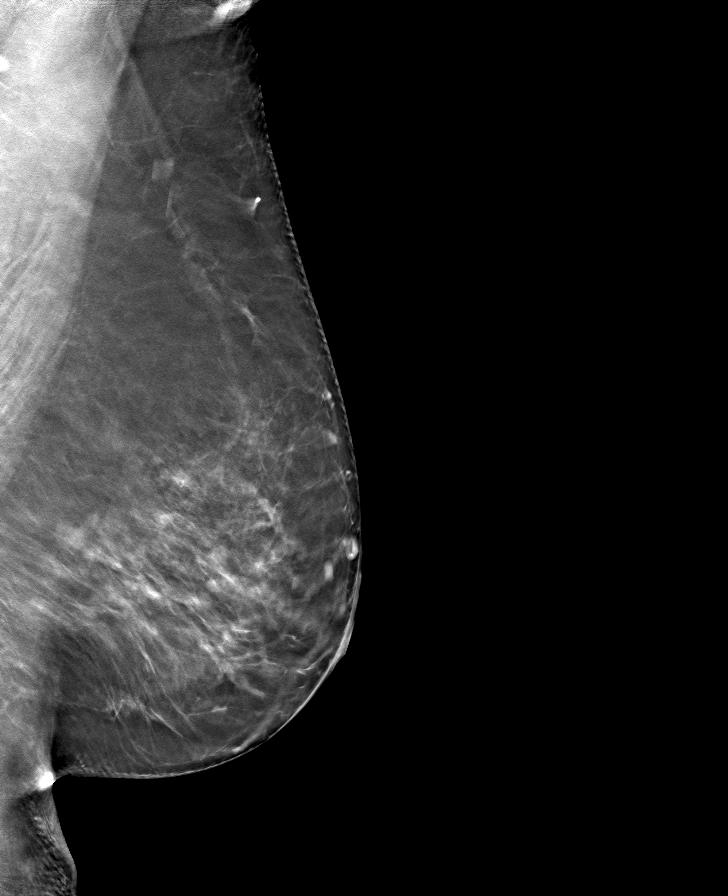

[R MLO tomo · tomo slice 43/85.0]
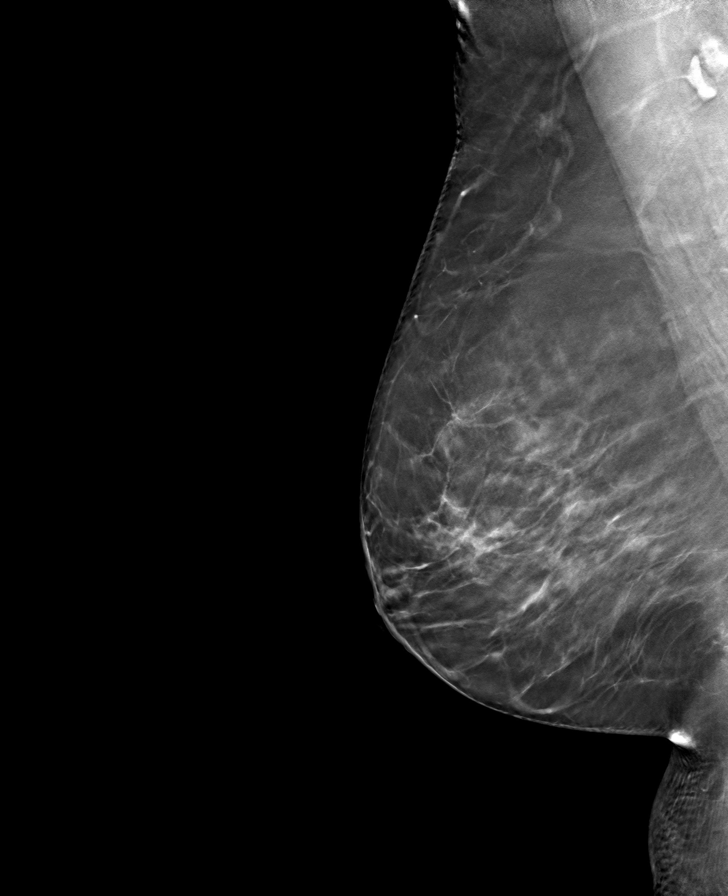

[8 of 24 positions shown; findings below may reference images not displayed]

ACR Breast Density Category b: There are scattered areas of
fibroglandular density.
FINDINGS: There are no findings suspicious for malignancy. Images were
processed with CAD.
IMPRESSION: No mammographic evidence of malignancy. A result letter of this
screening mammogram will be mailed directly to the patient.

RECOMMENDATION:
Screening mammogram in one year. (Code:[id number])

BI-RADS CATEGORY  1: Negative.

## 2019-06-07 ENCOUNTER — Other Ambulatory Visit: Payer: Self-pay | Admitting: Family Medicine

## 2019-06-07 ENCOUNTER — Other Ambulatory Visit (INDEPENDENT_AMBULATORY_CARE_PROVIDER_SITE_OTHER): Payer: Medicare Other

## 2019-06-07 ENCOUNTER — Ambulatory Visit: Payer: Medicare Other

## 2019-06-07 DIAGNOSIS — M810 Age-related osteoporosis without current pathological fracture: Secondary | ICD-10-CM | POA: Diagnosis not present

## 2019-06-07 DIAGNOSIS — E7800 Pure hypercholesterolemia, unspecified: Secondary | ICD-10-CM

## 2019-06-07 LAB — COMPREHENSIVE METABOLIC PANEL
ALT: 11 U/L (ref 0–35)
AST: 19 U/L (ref 0–37)
Albumin: 4 g/dL (ref 3.5–5.2)
Alkaline Phosphatase: 62 U/L (ref 39–117)
BUN: 17 mg/dL (ref 6–23)
CO2: 32 mEq/L (ref 19–32)
Calcium: 9.9 mg/dL (ref 8.4–10.5)
Chloride: 104 mEq/L (ref 96–112)
Creatinine, Ser: 1.13 mg/dL (ref 0.40–1.20)
GFR: 47.17 mL/min — ABNORMAL LOW (ref >60.00)
Glucose, Bld: 94 mg/dL (ref 70–99)
Potassium: 3.7 mEq/L (ref 3.5–5.1)
Sodium: 143 mEq/L (ref 135–145)
Total Bilirubin: 1.2 mg/dL (ref 0.2–1.2)
Total Protein: 6.7 g/dL (ref 6.0–8.3)

## 2019-06-07 LAB — LIPID PANEL
Cholesterol: 189 mg/dL (ref 0–200)
HDL: 50.6 mg/dL (ref >39.00)
LDL Cholesterol: 115 mg/dL — ABNORMAL HIGH (ref 0–99)
NonHDL: 138.17
Total CHOL/HDL Ratio: 4
Triglycerides: 115 mg/dL (ref 0.0–149.0)
VLDL: 23 mg/dL (ref 0.0–40.0)

## 2019-06-07 LAB — VITAMIN D 25 HYDROXY (VIT D DEFICIENCY, FRACTURES): VITD: 95.3 ng/mL (ref 30.00–100.00)

## 2019-06-14 ENCOUNTER — Encounter: Payer: Self-pay | Admitting: Family Medicine

## 2019-06-14 ENCOUNTER — Ambulatory Visit (INDEPENDENT_AMBULATORY_CARE_PROVIDER_SITE_OTHER): Payer: Medicare Other | Admitting: Family Medicine

## 2019-06-14 ENCOUNTER — Other Ambulatory Visit: Payer: Self-pay

## 2019-06-14 VITALS — BP 126/82 | HR 71 | Temp 96.6°F | Ht 65.0 in | Wt 195.3 lb

## 2019-06-14 DIAGNOSIS — G43909 Migraine, unspecified, not intractable, without status migrainosus: Secondary | ICD-10-CM

## 2019-06-14 DIAGNOSIS — Z7189 Other specified counseling: Secondary | ICD-10-CM

## 2019-06-14 DIAGNOSIS — R1310 Dysphagia, unspecified: Secondary | ICD-10-CM

## 2019-06-14 DIAGNOSIS — E7800 Pure hypercholesterolemia, unspecified: Secondary | ICD-10-CM

## 2019-06-14 DIAGNOSIS — M810 Age-related osteoporosis without current pathological fracture: Secondary | ICD-10-CM | POA: Diagnosis not present

## 2019-06-14 MED ORDER — CHOLECALCIFEROL 25 MCG (1000 UT) PO CAPS: 1000.0000 [IU] | ORAL_CAPSULE | ORAL | Status: DC

## 2019-06-14 MED ORDER — PANTOPRAZOLE SODIUM 40 MG PO TBEC: 40.0000 mg | DELAYED_RELEASE_TABLET | Freq: Every day | ORAL | 3 refills | Status: DC

## 2019-06-14 MED ORDER — PRAVASTATIN SODIUM 40 MG PO TABS: 40.0000 mg | ORAL_TABLET | Freq: Every day | ORAL | 3 refills | Status: DC

## 2019-06-14 MED ORDER — RIZATRIPTAN BENZOATE 10 MG PO TBDP: 10.0000 mg | ORAL_TABLET | ORAL | 2 refills | Status: DC | PRN

## 2019-06-14 NOTE — Assessment & Plan Note (Addendum)
Able to tolerate pravastatin.  Diet compliance: yes Exercise:yes Labs d/w pt.   Continue as is.  She agrees.  Continue work on diet and exercise.  >25 minutes spent in face to face time with patient, >50% spent in counselling or coordination of care

## 2019-06-14 NOTE — Patient Instructions (Signed)
Update me as needed.  Thanks for getting a flu shot.  Cut the vitamin D back to MWF.  Take care.  Glad to see you.

## 2019-06-14 NOTE — Assessment & Plan Note (Signed)
H/o dysphagia with prev EGD done and she is clearly improved after dilation.  No dysphagia now, having only rare sx.  Still on PPI.  cautions d/w pt.

## 2019-06-14 NOTE — Assessment & Plan Note (Signed)
Advance directive- would have husband designated if she were incapacitated. If husband were incapacitated, then would have daughters Robbin and Tracy equally designated.   

## 2019-06-14 NOTE — Progress Notes (Signed)
Elevated Cholesterol: Using medications without problems:yes Muscle aches: no Diet compliance: yes Exercise:yes Labs d/w pt.    Migraines.  No ADE on med.  Used prn with relief.  D/w pt about routine cautions. Rare use of med, rare sx/episodes.    H/o dysphagia with prev EGD done and she is clearly improved after dilation.  No dysphagia now, having only rare sx.  Still on PPI.  cautions d/w pt.    She hit her L4th fingernail on a shelf at home and chipped the end.  This was yesterday.  Locally tender, she wanted it checked to make sure she didn't have a FB   Osteoporosis.  DXA declined for now.she has prev intolerance to fosamax. Still on vit D.     She declined AMW visit.  She is up to date on health maintenance.  D/w pt.    Pandemic considerations d/w pt.   Flu vaccine - done 2020 PNA up to date.   Shingles - d/w pt, prev not covered by insurance per patient report.   Tetanus 2012 Hep C screening - negative, d/w pt.  cologuard 2019 Mammogram 2020 DXA declined for now.she has prev intolerance to fosamax. Still on vit D. Advance directive- would have husband designated if she were incapacitated. If husband were incapacitated, then would have daughters Rebecca Moran and Rebecca Moran equally designated.    PMH and SH reviewed  Meds, vitals, and allergies reviewed.   ROS: Per HPI unless specifically indicated in ROS section   GEN: nad, alert and oriented HEENT: ncat NECK: supple w/o LA CV: rrr. PULM: ctab, no inc wob ABD: soft, +bs EXT: no edema SKIN: no acute rash L 4th nail chipped/shortened but not ingrown.  Not bleeding, no FB seen with magnification and transillumination.  Doesn't appear infected.  D/w pt about supportive care.

## 2019-06-14 NOTE — Assessment & Plan Note (Signed)
DXA declined for now.she has prev intolerance to fosamax. Still on vit D, would cut back to 1000 IU MWF given her higher normal vit D level.  Labs d/w pt.

## 2019-06-14 NOTE — Assessment & Plan Note (Signed)
No ADE on med.  Used prn with relief.  D/w pt about routine cautions. Rare use of med, rare sx/episodes.

## 2019-07-01 DIAGNOSIS — L821 Other seborrheic keratosis: Secondary | ICD-10-CM | POA: Diagnosis not present

## 2019-07-01 DIAGNOSIS — L820 Inflamed seborrheic keratosis: Secondary | ICD-10-CM | POA: Diagnosis not present

## 2019-07-01 DIAGNOSIS — L918 Other hypertrophic disorders of the skin: Secondary | ICD-10-CM | POA: Diagnosis not present

## 2019-11-01 DIAGNOSIS — H2513 Age-related nuclear cataract, bilateral: Secondary | ICD-10-CM | POA: Diagnosis not present

## 2019-11-07 ENCOUNTER — Ambulatory Visit: Payer: Medicare Other | Attending: Internal Medicine

## 2019-11-07 DIAGNOSIS — Z23 Encounter for immunization: Secondary | ICD-10-CM

## 2019-11-07 NOTE — Progress Notes (Signed)
   Covid-19 Vaccination Clinic  Name:  Rebecca Moran    MRN: [id number] DOB: December 16, 1945  11/07/2019  [name] was observed post Covid-19 immunization for 15 minutes without incidence. She was provided with Vaccine Information Sheet and instruction to access the V-Safe system.   [name] was instructed to call 911 with any severe reactions post vaccine: Marland Kitchen Difficulty breathing  . Swelling of your face and throat  . A fast heartbeat  . A bad rash all over your body  . Dizziness and weakness    Immunizations Administered    Name Date Dose VIS Date Route   Pfizer COVID-19 Vaccine 11/07/2019  8:12 AM 0.3 mL 09/03/2019 Intramuscular   Manufacturer: [employer], [employer]   Lot: [id number]   NDC: [address]-1

## 2019-11-30 ENCOUNTER — Ambulatory Visit: Payer: Medicare Other | Attending: Internal Medicine

## 2019-11-30 DIAGNOSIS — Z23 Encounter for immunization: Secondary | ICD-10-CM | POA: Insufficient documentation

## 2019-11-30 NOTE — Progress Notes (Signed)
   Covid-19 Vaccination Clinic  Name:  Rebecca Moran    MRN: [id number] DOB: Nov 12, 1945  11/30/2019  [name] was observed post Covid-19 immunization for 15 minutes without incident. She was provided with Vaccine Information Sheet and instruction to access the V-Safe system.   [name] was instructed to call 911 with any severe reactions post vaccine: Marland Kitchen Difficulty breathing  . Swelling of face and throat  . A fast heartbeat  . A bad rash all over body  . Dizziness and weakness   Immunizations Administered    Name Date Dose VIS Date Route   Pfizer COVID-19 Vaccine 11/30/2019  2:41 PM 0.3 mL 09/03/2019 Intramuscular   Manufacturer: [employer], [employer]   Lot: [id number]   NDC: [address]-6

## 2020-02-11 ENCOUNTER — Telehealth: Payer: Self-pay

## 2020-02-11 ENCOUNTER — Ambulatory Visit: Payer: Medicare Other

## 2020-02-11 NOTE — Telephone Encounter (Signed)
Called and talked to her about the situation, I'll await inpatient reports.  I wish her and her husband the best.  I thank all involved.  She thanked me for the call.

## 2020-02-11 NOTE — Telephone Encounter (Signed)
Medicare wellness visit cancelled because patient is at the hospital with her husband. Patient states that her husband fell on Wednesday and dislocated his shoulder. Yesterday patient started complaining of shortness of breath and he was treated in the ED and immediately admitted into the hospital. Would like for Dr. Para March to call her on her cell if he has time.

## 2020-04-13 ENCOUNTER — Telehealth: Payer: Self-pay | Admitting: Family Medicine

## 2020-04-13 ENCOUNTER — Other Ambulatory Visit: Payer: Self-pay | Admitting: Family Medicine

## 2020-04-13 DIAGNOSIS — Z1231 Encounter for screening mammogram for malignant neoplasm of breast: Secondary | ICD-10-CM

## 2020-04-13 NOTE — Telephone Encounter (Signed)
Patient called stating she needs a referral sent to [facility] for annual mammogram. Please call patient [phone] when its sent over.

## 2020-04-13 NOTE — Telephone Encounter (Signed)
Patient says she tried to schedule her mammogram and was told they needed a referral from Dr. Para March.

## 2020-04-14 NOTE — Telephone Encounter (Signed)
Ordered.  Thanks.  Please notify patient.

## 2020-05-12 ENCOUNTER — Other Ambulatory Visit: Payer: Self-pay

## 2020-05-12 ENCOUNTER — Ambulatory Visit
Admission: RE | Admit: 2020-05-12 | Discharge: 2020-05-12 | Disposition: A | Discharge status: Home / Self Care | Payer: Medicare Other | Source: Ambulatory Visit | Attending: Family Medicine | Admitting: Family Medicine

## 2020-05-12 DIAGNOSIS — Z1231 Encounter for screening mammogram for malignant neoplasm of breast: Secondary | ICD-10-CM | POA: Insufficient documentation

## 2020-06-18 ENCOUNTER — Other Ambulatory Visit: Payer: Self-pay | Admitting: Family Medicine

## 2020-07-12 ENCOUNTER — Other Ambulatory Visit: Payer: Self-pay | Admitting: Family Medicine

## 2020-09-25 ENCOUNTER — Other Ambulatory Visit: Payer: Self-pay | Admitting: Family Medicine

## 2020-09-25 NOTE — Telephone Encounter (Signed)
Pharmacy requests refill on: Pravastatin 40 mg   LAST REFILL: 06/20/2020 LAST OV: 06/13/2020 NEXT OV: Not Scheduled  PHARMACY: [id number] [place], [place]

## 2020-09-27 ENCOUNTER — Ambulatory Visit
Admission: EM | Admit: 2020-09-27 | Discharge: 2020-09-27 | Disposition: A | Discharge status: Home / Self Care | Payer: Medicare Other | Attending: Family Medicine | Admitting: Family Medicine

## 2020-09-27 ENCOUNTER — Other Ambulatory Visit: Payer: Self-pay

## 2020-09-27 DIAGNOSIS — Z1152 Encounter for screening for COVID-19: Secondary | ICD-10-CM | POA: Diagnosis not present

## 2020-09-29 LAB — NOVEL CORONAVIRUS, NAA: SARS-CoV-2, NAA: NOT DETECTED

## 2020-09-29 LAB — SARS-COV-2, NAA 2 DAY TAT

## 2020-10-09 ENCOUNTER — Other Ambulatory Visit: Payer: Self-pay | Admitting: Family Medicine

## 2020-10-09 DIAGNOSIS — E7800 Pure hypercholesterolemia, unspecified: Secondary | ICD-10-CM

## 2020-10-09 DIAGNOSIS — M810 Age-related osteoporosis without current pathological fracture: Secondary | ICD-10-CM

## 2020-10-10 ENCOUNTER — Other Ambulatory Visit: Payer: Self-pay | Admitting: Family Medicine

## 2020-10-20 ENCOUNTER — Other Ambulatory Visit: Payer: Self-pay

## 2020-10-20 ENCOUNTER — Other Ambulatory Visit (INDEPENDENT_AMBULATORY_CARE_PROVIDER_SITE_OTHER): Payer: Medicare Other

## 2020-10-20 ENCOUNTER — Ambulatory Visit (INDEPENDENT_AMBULATORY_CARE_PROVIDER_SITE_OTHER): Payer: Medicare Other

## 2020-10-20 DIAGNOSIS — M810 Age-related osteoporosis without current pathological fracture: Secondary | ICD-10-CM | POA: Diagnosis not present

## 2020-10-20 DIAGNOSIS — Z0000 Encounter for general adult medical examination without abnormal findings: Secondary | ICD-10-CM

## 2020-10-20 DIAGNOSIS — E7800 Pure hypercholesterolemia, unspecified: Secondary | ICD-10-CM

## 2020-10-20 LAB — LIPID PANEL
Cholesterol: 182 mg/dL (ref 0–200)
HDL: 51.2 mg/dL (ref >39.00)
LDL Cholesterol: 108 mg/dL — ABNORMAL HIGH (ref 0–99)
NonHDL: 130.62
Total CHOL/HDL Ratio: 4
Triglycerides: 113 mg/dL (ref 0.0–149.0)
VLDL: 22.6 mg/dL (ref 0.0–40.0)

## 2020-10-20 LAB — COMPREHENSIVE METABOLIC PANEL
ALT: 13 U/L (ref 0–35)
AST: 21 U/L (ref 0–37)
Albumin: 4.2 g/dL (ref 3.5–5.2)
Alkaline Phosphatase: 67 U/L (ref 39–117)
BUN: 15 mg/dL (ref 6–23)
CO2: 31 mEq/L (ref 19–32)
Calcium: 9.6 mg/dL (ref 8.4–10.5)
Chloride: 106 mEq/L (ref 96–112)
Creatinine, Ser: 1.11 mg/dL (ref 0.40–1.20)
GFR: 48.93 mL/min — ABNORMAL LOW (ref >60.00)
Glucose, Bld: 86 mg/dL (ref 70–99)
Potassium: 4 mEq/L (ref 3.5–5.1)
Sodium: 143 mEq/L (ref 135–145)
Total Bilirubin: 1.4 mg/dL — ABNORMAL HIGH (ref 0.2–1.2)
Total Protein: 6.8 g/dL (ref 6.0–8.3)

## 2020-10-20 LAB — VITAMIN D 25 HYDROXY (VIT D DEFICIENCY, FRACTURES): VITD: 47.65 ng/mL (ref 30.00–100.00)

## 2020-10-20 NOTE — Progress Notes (Signed)
PCP notes:  Health Maintenance: dexa- declined  Abnormal Screenings: none   Patient concerns: none   Nurse concerns: none   Next PCP appt.: 10/23/2020 @ 12 pm

## 2020-10-20 NOTE — Progress Notes (Signed)
Subjective:   Rebecca Moran is a 75 y.o. female who presents for Medicare Annual (Subsequent) preventive examination.  Review of Systems: N/A      I connected with the patient today by telephone and verified that I am speaking with the correct person using two identifiers. Location patient: home Location nurse: work Persons participating in the telephone visit: patient, nurse.   I discussed the limitations, risks, security and privacy concerns of performing an evaluation and management service by telephone and the availability of in person appointments. I also discussed with the patient that there may be a patient responsible charge related to this service. The patient expressed understanding and verbally consented to this telephonic visit.        Cardiac Risk Factors include: advanced age (>8men, >14 women);Other (see comment), Risk factor comments: hypercholesterolemia     Objective:    Today's Vitals   There is no height or weight on file to calculate BMI.  Advanced Directives 10/20/2020 08/04/2018 06/02/2018 01/13/2018 10/18/2017 06/13/2016  Does Patient Have a Medical Advance Directive? No No No No No No  Would patient like information on creating a medical advance directive? No - Patient declined No - Patient declined No - Patient declined No - Patient declined No - Patient declined Yes - Educational materials given    Current Medications (verified) Outpatient Encounter Medications as of 10/20/2020  Medication Sig  . cetirizine (ZYRTEC) 10 MG tablet Take 10 mg by mouth daily.  . Cholecalciferol ([employer] VITAMIN D3) 25 MCG (1000 UT) capsule Take 1 capsule (1,000 Units total) by mouth every Monday, Wednesday, and Friday.  . fluticasone (FLONASE) 50 MCG/ACT nasal spray Place 2 sprays into both nostrils as needed.   . pantoprazole (PROTONIX) 40 MG tablet TAKE 1 TABLET(40 MG) BY MOUTH DAILY  . pravastatin (PRAVACHOL) 40 MG tablet TAKE 1 TABLET BY MOUTH EVERY DAY. PLEASE MAKE  APPT FOR MORE REFILLS  . rizatriptan (MAXALT-MLT) 10 MG disintegrating tablet Take 1 tablet (10 mg total) by mouth as needed. May repeat in 2 hours if needed   No facility-administered encounter medications on file as of 10/20/2020.    Allergies (verified) Crestor [rosuvastatin calcium], Fosamax [alendronate sodium], Oxycodone-acetaminophen, Prednisone, and Sumatriptan   History: Past Medical History:  Diagnosis Date  . Allergy    seasonal  . Family history of cancer   . Headache   . Hearing loss    in L ear  . History of migraines    MRI -head,secondary H?A L_OCCIP within normal limits 07/1999//CT of head without Normal ([facility]) 12/21/2007  . Hyperlipidemia   . NSVD (normal spontaneous vaginal delivery)    x 2  . Osteoporosis    Past Surgical History:  Procedure Laterality Date  . ABDOMINAL HYSTERECTOMY  11/99   partial hysterectomy ovaries intact (fibroids  . COLONOSCOPY WITH PROPOFOL N/A 01/13/2018   Procedure: COLONOSCOPY WITH PROPOFOL;  Surgeon: [name], MD;  [facility];  Service: Endoscopy;  Laterality: N/A;  . ESOPHAGOGASTRODUODENOSCOPY (EGD) WITH PROPOFOL N/A 01/13/2018   Procedure: ESOPHAGOGASTRODUODENOSCOPY (EGD) WITH PROPOFOL;  Surgeon: [name], MD;  Location: [facility] ENDOSCOPY;  Service: Endoscopy;  Laterality: N/A;  . normal vaginal delivery x2 [facility]    . stress cardiolite  04/28/2001   stress cardiolite wnl, EF 69%  . TUBAL LIGATION  1969   Family History  Problem Relation Age of Onset  . Heart disease Mother 54       CHF  . Cancer Mother 63  lung cancer since her 77's with kidney cancer //cancer of the throat and tongue //never smoked or drank  . Diabetes Mother        borderline  . Cancer Father 34       melanoma  . Cancer Sister        breast cancer and ovarian  . Breast cancer Sister   . Ovarian cancer Sister   . Bone cancer Sister   . Heart disease Paternal Grandfather        MI  . Diabetes  Brother   . Stroke Maternal Grandmother   . Depression Maternal Grandfather   . Colon cancer Neg Hx    Social History   Socioeconomic History  . Marital status: Married    Spouse name: Not on file  . Number of children: 2  . Years of education: Not on file  . Highest education level: Not on file  Occupational History  . 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 Smoking status: Former Smoker    Packs/day: 2.00    Years: 22.00    Pack years: 44.00    Quit date: 09/23/1981    Years since quitting: 39.1  . Smokeless tobacco: Never Used  . Tobacco comment: quit in 1975  Vaping Use  . Vaping Use: Never used  Substance and Sexual Activity  . Alcohol use: No  . Drug use: No  . Sexual activity: Never  Other Topics Concern  . Not on file  Social History Narrative   Married- 1967   Two stepchildren and two children of her own.   retired   International aid/development worker of Corporate investment banker Strain: Low Risk   . Difficulty of Paying Living Expenses: Not hard at all  Food Insecurity: No Food Insecurity  . Worried About Programme researcher, broadcasting/film/video in the Last Year: Never true  . Ran Out of Food in the Last Year: Never true  Transportation Needs: No Transportation Needs  . Lack of Transportation (Medical): No  . Lack of Transportation (Non-Medical): No  Physical Activity: Inactive  . Days of Exercise per Week: 0 days  . Minutes of Exercise per Session: 0 min  Stress: No Stress Concern Present  . Feeling of Stress : Not at all  Social Connections: Not on file    Tobacco Counseling Counseling given: Not Answered Comment: quit in 1975   Clinical Intake:  Pre-visit preparation completed: Yes  Pain : No/denies pain     Nutritional Risks: None Diabetes: No  How often do you need to have someone help you when you read instructions, pamphlets, or other written materials from your doctor or pharmacy?: 1 -  Never  Diabetic: No Nutrition Risk Assessment:  Has the patient had any N/V/D within the last 2 months?  No  Does the patient have any non-healing wounds?  No  Has the patient had any unintentional weight loss or weight gain?  No   Diabetes:  Is the patient diabetic?  No  If diabetic, was a CBG obtained today?  N/A Did the patient bring in their glucometer from home?  N/A How often do you monitor your CBG's? N/A.   Financial Strains and Diabetes Management:  Are you having any financial strains with the device, your supplies or your medication? N/A.  Does the patient want to be seen by Chronic Care Management for management of their diabetes?  N/A Would  the patient like to be referred to a Nutritionist or for Diabetic Management?  N/A    Interpreter Needed?: No  Information entered by :: CJohnson, LPN   Activities of Daily Living In your present state of health, do you have any difficulty performing the following activities: 10/20/2020  Hearing? Y  Comment deaf in left ear  Vision? N  Difficulty concentrating or making decisions? N  Walking or climbing stairs? N  Dressing or bathing? N  Doing errands, shopping? N  Preparing Food and eating ? N  Using the Toilet? N  In the past six months, have you accidently leaked urine? N  Do you have problems with loss of bowel control? N  Managing your Medications? N  Managing your Finances? N  Housekeeping or managing your Housekeeping? N  Some recent data might be hidden    Patient Care Team: Joaquim Nam, MD as PCP - General (Family Medicine) Iran Ouch, MD as PCP - Cardiology (Cardiology)  Indicate any recent Medical Services you may have received from other than [facility] providers in the past year (date may be approximate).     Assessment:   This is a routine wellness examination for [place].  Hearing/Vision screen  Hearing Screening   125Hz  250Hz  500Hz  1000Hz  2000Hz  3000Hz  4000Hz  6000Hz  8000Hz   Right ear:            Left ear:           Vision Screening Comments: Patient gets annual eye exams   Dietary issues and exercise activities discussed: Current Exercise Habits: The patient does not participate in regular exercise at present, Exercise limited by: None identified  Goals    . Increase physical activity     Starting 06/02/2018, I will continue to walk for 10-20 minutes daily.     . Patient Stated     10/20/2020, I will maintain and continue medications as prescribed.      Depression Screen PHQ 2/9 Scores 10/20/2020 06/14/2019 06/02/2018 02/20/2018 06/13/2016 12/09/2014  PHQ - 2 Score 0 0 0 0 0 0  PHQ- 9 Score 0 - 0 - - -    Fall Risk Fall Risk  10/20/2020 06/14/2019 06/02/2018 02/20/2018 06/13/2016  Falls in the past year? 0 0 No No No  Number falls in past yr: 0 - - - -  Injury with Fall? 0 - - - -  Risk for fall due to : Medication side effect - - - -  Follow up Falls prevention discussed;Falls evaluation completed - - - -    FALL RISK PREVENTION PERTAINING TO THE HOME:  Any stairs in or around the home? Yes  If so, are there any without handrails? No  Home free of loose throw rugs in walkways, pet beds, electrical cords, etc? Yes  Adequate lighting in your home to reduce risk of falls? Yes   ASSISTIVE DEVICES UTILIZED TO PREVENT FALLS:  Life alert? No  Use of a cane, walker or w/c? No  Grab bars in the bathroom? No  Shower chair or bench in shower? No  Elevated toilet seat or a handicapped toilet? No   TIMED UP AND GO:  Was the test performed? N/A telephone visit .   Cognitive Function: MMSE - Mini Mental State Exam 10/20/2020 06/02/2018 06/13/2016  Not completed: Unable to complete - -  Orientation to time - 5 5  Orientation to Place - 5 5  Registration - 3 3  Attention/ Calculation - 0 0  Recall - 2  3  Recall-comments - unable to recall 1 of 3 words -  Language- name 2 objects - 0 0  Language- repeat - 1 1  Language- follow 3 step command - 3 3  Language- read & follow  direction - 0 0  Write a sentence - 0 0  Copy design - 0 0  Total score - 19 20  Mini Cog  Mini-Cog screen was not completed. Patient did not have time. Maximum score is 22. A value of 0 denotes this part of the MMSE was not completed or the patient failed this part of the Mini-Cog screening.       Immunizations Immunization History  Administered Date(s) Administered  . Influenza Split 07/03/2012, 06/25/2013  . Influenza, High Dose Seasonal PF 06/09/2017, 05/28/2019  . Influenza,inj,Quad PF,6+ Mos 06/09/2018, 05/28/2019  . Influenza-Unspecified 06/17/2014, 05/07/2016, 06/09/2017, 06/09/2018, 05/30/2020  . PFIZER(Purple Top)SARS-COV-2 Vaccination 11/07/2019, 11/30/2019, 08/29/2020  . Pneumococcal Conjugate-13 05/07/2016  . Pneumococcal Polysaccharide-23 10/29/2013  . Td 10/11/1999  . Tdap 03/29/2011, 08/04/2018    TDAP status: Up to date  Flu Vaccine status: Up to date  Pneumococcal vaccine status: Up to date  Covid-19 vaccine status: Completed vaccines  Qualifies for Shingles Vaccine? Yes   Zostavax completed No   Shingrix Completed?: No.    Education has been provided regarding the importance of this vaccine. Patient has been advised to call insurance company to determine out of pocket expense if they have not yet received this vaccine. Advised may also receive vaccine at local pharmacy or Health Dept. Verbalized acceptance and understanding.  Screening Tests Health Maintenance  Topic Date Due  . Fecal DNA (Cologuard)  03/31/2021  . MAMMOGRAM  05/12/2022  . TETANUS/TDAP  08/04/2028  . INFLUENZA VACCINE  Completed  . DEXA SCAN  Completed  . COVID-19 Vaccine  Completed  . Hepatitis C Screening  Completed  . PNA vac Low Risk Adult  Completed    Health Maintenance  There are no preventive care reminders to display for this patient.  Colorectal cancer screening: Type of screening: Cologuard. Completed 03/31/2018. Repeat every 3 years  Mammogram status: Completed  05/12/2020. Repeat every year  Bone Density status: declined at this time   Lung Cancer Screening: (Low Dose CT Chest recommended if Age 83-80 years, 30 pack-year currently smoking OR have quit w/in 15 years.) does not qualify.    Additional Screening:  Hepatitis C Screening: does qualify; Completed 06/17/2016  Vision Screening: Recommended annual ophthalmology exams for early detection of glaucoma and other disorders of the eye. Is the patient up to date with their annual eye exam?  Yes  Who is the provider or what is the name of the office in which the patient attends annual eye exams? Unknown  If pt is not established with a provider, would they like to be referred to a provider to establish care? No .   Dental Screening: Recommended annual dental exams for proper oral hygiene  Community Resource Referral / Chronic Care Management: CRR required this visit?  No   CCM required this visit?  No      Plan:     I have personally reviewed and noted the following in the patient's chart:   . Medical and social history . Use of alcohol, tobacco or illicit drugs  . Current medications and supplements . Functional ability and status . Nutritional status . Physical activity . Advanced directives . List of other physicians . Hospitalizations, surgeries, and ER visits in previous 12 months .  Vitals . Screenings to include cognitive, depression, and falls . Referrals and appointments  In addition, I have reviewed and discussed with patient certain preventive protocols, quality metrics, and best practice recommendations. A written personalized care plan for preventive services as well as general preventive health recommendations were provided to patient.   Due to this being a telephonic visit, the after visit summary with patients personalized plan was offered to patient via office or my-chart. Patient preferred to pick up at office at next visit or via mychart.   Janalyn Shy,  LPN   01/04/2439

## 2020-10-20 NOTE — Patient Instructions (Signed)
Rebecca Moran , Thank you for taking time to come for your Medicare Wellness Visit. I appreciate your ongoing commitment to your health goals. Please review the following plan we discussed and let me know if I can assist you in the future.   Screening recommendations/referrals: Colonoscopy: Cologuard completed 03/31/2018, due 03/2021 Mammogram: Up to date, completed 05/12/2020, due 04/2021 Bone Density: declined Recommended yearly ophthalmology/optometry visit for glaucoma screening and checkup Recommended yearly dental visit for hygiene and checkup  Vaccinations: Influenza vaccine: Up to date, completed 05/30/2020, due 04/2021 Pneumococcal vaccine: Completed series Tdap vaccine: Up to date, completed 08/04/2018, due 07/2028 Shingles vaccine: due, check with your insurance regarding coverage if interested    Covid-19: Completed series  Advanced directives: Advance directive discussed with you today. Even though you declined this today please call our office should you change your mind and we can give you the proper paperwork for you to fill out.  Conditions/risks identified: hypercholesterolemia   Next appointment: Follow up in one year for your annual wellness visit    Preventive Care 75 Years and Older, Female Preventive care refers to lifestyle choices and visits with your health care provider that can promote health and wellness. What does preventive care include?  A yearly physical exam. This is also called an annual well check.  Dental exams once or twice a year.  Routine eye exams. Ask your health care provider how often you should have your eyes checked.  Personal lifestyle choices, including:  Daily care of your teeth and gums.  Regular physical activity.  Eating a healthy diet.  Avoiding tobacco and drug use.  Limiting alcohol use.  Practicing safe sex.  Taking low-dose aspirin every day.  Taking vitamin and mineral supplements as recommended by your health care  provider. What happens during an annual well check? The services and screenings done by your health care provider during your annual well check will depend on your age, overall health, lifestyle risk factors, and family history of disease. Counseling  Your health care provider may ask you questions about your:  Alcohol use.  Tobacco use.  Drug use.  Emotional well-being.  Home and relationship well-being.  Sexual activity.  Eating habits.  History of falls.  Memory and ability to understand (cognition).  Work and work Astronomer.  Reproductive health. Screening  You may have the following tests or measurements:  Height, weight, and BMI.  Blood pressure.  Lipid and cholesterol levels. These may be checked every 5 years, or more frequently if you are over 75 years old.  Skin check.  Lung cancer screening. You may have this screening every year starting at age 75 if you have a 30-pack-year history of smoking and currently smoke or have quit within the past 15 years.  Fecal occult blood test (FOBT) of the stool. You may have this test every year starting at age 75.  Flexible sigmoidoscopy or colonoscopy. You may have a sigmoidoscopy every 5 years or a colonoscopy every 10 years starting at age 75.  Hepatitis C blood test.  Hepatitis B blood test.  Sexually transmitted disease (STD) testing.  Diabetes screening. This is done by checking your blood sugar (glucose) after you have not eaten for a while (fasting). You may have this done every 1-3 years.  Bone density scan. This is done to screen for osteoporosis. You may have this done starting at age 75.  Mammogram. This may be done every 1-2 years. Talk to your health care provider about how often you  should have regular mammograms. Talk with your health care provider about your test results, treatment options, and if necessary, the need for more tests. Vaccines  Your health care provider may recommend certain  vaccines, such as:  Influenza vaccine. This is recommended every year.  Tetanus, diphtheria, and acellular pertussis (Tdap, Td) vaccine. You may need a Td booster every 10 years.  Zoster vaccine. You may need this after age 75.  Pneumococcal 13-valent conjugate (PCV13) vaccine. One dose is recommended after age 75.  Pneumococcal polysaccharide (PPSV23) vaccine. One dose is recommended after age 75. Talk to your health care provider about which screenings and vaccines you need and how often you need them. This information is not intended to replace advice given to you by your health care provider. Make sure you discuss any questions you have with your health care provider. Document Released: 10/06/2015 Document Revised: 05/29/2016 Document Reviewed: 07/11/2015 Elsevier Interactive Patient Education  2017 [employer] Prevention in the Home Falls can cause injuries. They can happen to people of all ages. There are many things you can do to make your home safe and to help prevent falls. What can I do on the outside of my home?  Regularly fix the edges of walkways and driveways and fix any cracks.  Remove anything that might make you trip as you walk through a door, such as a raised step or threshold.  Trim any bushes or trees on the path to your home.  Use bright outdoor lighting.  Clear any walking paths of anything that might make someone trip, such as rocks or tools.  Regularly check to see if handrails are loose or broken. Make sure that both sides of any steps have handrails.  Any raised decks and porches should have guardrails on the edges.  Have any leaves, snow, or ice cleared regularly.  Use sand or salt on walking paths during winter.  Clean up any spills in your garage right away. This includes oil or grease spills. What can I do in the bathroom?  Use night lights.  Install grab bars by the toilet and in the tub and shower. Do not use towel bars as grab  bars.  Use non-skid mats or decals in the tub or shower.  If you need to sit down in the shower, use a plastic, non-slip stool.  Keep the floor dry. Clean up any water that spills on the floor as soon as it happens.  Remove soap buildup in the tub or shower regularly.  Attach bath mats securely with double-sided non-slip rug tape.  Do not have throw rugs and other things on the floor that can make you trip. What can I do in the bedroom?  Use night lights.  Make sure that you have a light by your bed that is easy to reach.  Do not use any sheets or blankets that are too big for your bed. They should not hang down onto the floor.  Have a firm chair that has side arms. You can use this for support while you get dressed.  Do not have throw rugs and other things on the floor that can make you trip. What can I do in the kitchen?  Clean up any spills right away.  Avoid walking on wet floors.  Keep items that you use a lot in easy-to-reach places.  If you need to reach something above you, use a strong step stool that has a grab bar.  Keep electrical cords out  of the way.  Do not use floor polish or wax that makes floors slippery. If you must use wax, use non-skid floor wax.  Do not have throw rugs and other things on the floor that can make you trip. What can I do with my stairs?  Do not leave any items on the stairs.  Make sure that there are handrails on both sides of the stairs and use them. Fix handrails that are broken or loose. Make sure that handrails are as long as the stairways.  Check any carpeting to make sure that it is firmly attached to the stairs. Fix any carpet that is loose or worn.  Avoid having throw rugs at the top or bottom of the stairs. If you do have throw rugs, attach them to the floor with carpet tape.  Make sure that you have a light switch at the top of the stairs and the bottom of the stairs. If you do not have them, ask someone to add them for  you. What else can I do to help prevent falls?  Wear shoes that:  Do not have high heels.  Have rubber bottoms.  Are comfortable and fit you well.  Are closed at the toe. Do not wear sandals.  If you use a stepladder:  Make sure that it is fully opened. Do not climb a closed stepladder.  Make sure that both sides of the stepladder are locked into place.  Ask someone to hold it for you, if possible.  Clearly mark and make sure that you can see:  Any grab bars or handrails.  First and last steps.  Where the edge of each step is.  Use tools that help you move around (mobility aids) if they are needed. These include:  Canes.  Walkers.  Scooters.  Crutches.  Turn on the lights when you go into a dark area. Replace any light bulbs as soon as they burn out.  Set up your furniture so you have a clear path. Avoid moving your furniture around.  If any of your floors are uneven, fix them.  If there are any pets around you, be aware of where they are.  Review your medicines with your doctor. Some medicines can make you feel dizzy. This can increase your chance of falling. Ask your doctor what other things that you can do to help prevent falls. This information is not intended to replace advice given to you by your health care provider. Make sure you discuss any questions you have with your health care provider. Document Released: 07/06/2009 Document Revised: 02/15/2016 Document Reviewed: 10/14/2014 Elsevier Interactive Patient Education  2017 [employer].

## 2020-10-23 ENCOUNTER — Ambulatory Visit (INDEPENDENT_AMBULATORY_CARE_PROVIDER_SITE_OTHER): Payer: Medicare Other | Admitting: Family Medicine

## 2020-10-23 ENCOUNTER — Other Ambulatory Visit: Payer: Self-pay

## 2020-10-23 ENCOUNTER — Encounter: Payer: Self-pay | Admitting: Family Medicine

## 2020-10-23 VITALS — BP 124/70 | HR 76 | Temp 98.1°F | Ht 65.0 in | Wt 190.0 lb

## 2020-10-23 DIAGNOSIS — G43909 Migraine, unspecified, not intractable, without status migrainosus: Secondary | ICD-10-CM

## 2020-10-23 DIAGNOSIS — Z7189 Other specified counseling: Secondary | ICD-10-CM

## 2020-10-23 DIAGNOSIS — R1310 Dysphagia, unspecified: Secondary | ICD-10-CM

## 2020-10-23 DIAGNOSIS — Z0000 Encounter for general adult medical examination without abnormal findings: Secondary | ICD-10-CM

## 2020-10-23 DIAGNOSIS — E7800 Pure hypercholesterolemia, unspecified: Secondary | ICD-10-CM

## 2020-10-23 MED ORDER — PANTOPRAZOLE SODIUM 40 MG PO TBEC: DELAYED_RELEASE_TABLET | ORAL | 3 refills | Status: DC

## 2020-10-23 MED ORDER — PRAVASTATIN SODIUM 40 MG PO TABS: ORAL_TABLET | ORAL | 3 refills | Status: DC

## 2020-10-23 MED ORDER — RIZATRIPTAN BENZOATE 10 MG PO TBDP: 10.0000 mg | ORAL_TABLET | ORAL | 2 refills | Status: DC | PRN

## 2020-10-23 NOTE — Patient Instructions (Signed)
Update me as needed.  Thanks for your effort.  Take care.  Glad to see you. 

## 2020-10-23 NOTE — Progress Notes (Signed)
This visit occurred during the SARS-CoV-2 public health emergency.  Safety protocols were in place, including screening questions prior to the visit, additional usage of staff PPE, and extensive cleaning of exam room while observing appropriate contact time as indicated for disinfecting solutions.  Elevated Cholesterol: Using medications without problems: yes Muscle aches: no Diet compliance: yes Exercise: yes  GERD controlled.  She had h/o esophageal stretching with improved dysphagia.  Only rare sx now, cautions d/w pt.  No blood in stool.  No black stools.    Migraine.  Prn maxalt.  No recent flares, since retiring.  Doing well.  She has med to use if needed.    covid cautions d/w pt.    Flu vaccine - done 2021 PNA up to date.   Shingles - d/w pt.   Tetanus 2019 covid vaccine up to date.   Hep C screening - negative prev, d/w pt.  cologuard 2019, can repeat in summer 2022 Mammogram 2021 DXA declined for now.she has prev intolerance to fosamax. Still on vit D. Advance directive- would have Rebecca Moran designated if she were incapacitated. If Rebecca Moran were incapacitated, then would have daughters Rebecca Moran and Rebecca Moran equally designated.    PMH and SH reviewed  Meds, vitals, and allergies reviewed.   ROS: Per HPI unless specifically indicated in ROS section   GEN: nad, alert and oriented HEENT: ncat NECK: supple w/o LA CV: rrr. PULM: ctab, no inc wob ABD: soft, +bs EXT: no edema SKIN: no acute rash

## 2020-10-25 NOTE — Assessment & Plan Note (Signed)
Labs discussed with patient.  Continue pravastatin.  Continue work on diet and exercise.  She will update me as needed.  She agrees.

## 2020-10-25 NOTE — Assessment & Plan Note (Signed)
Advance directive- would have husband designated if she were incapacitated. If husband were incapacitated, then would have daughters Kathe Becton and French Ana equally designated.

## 2020-10-25 NOTE — Assessment & Plan Note (Signed)
GERD controlled.  She had h/o esophageal stretching with improved dysphagia.  Only rare sx now, cautions d/w pt.  No blood in stool.  No black stools.  Continue pantoprazole.

## 2020-10-25 NOTE — Assessment & Plan Note (Signed)
Flu vaccine - done 2021 PNA up to date.   Shingles - d/w pt.   Tetanus 2019 covid vaccine up to date.   Hep C screening - negative prev, d/w pt.  cologuard 2019, can repeat in summer 2022 Mammogram 2021 DXA declined for now.she has prev intolerance to fosamax. Still on vit D. Advance directive- would have husband designated if she were incapacitated. If husband were incapacitated, then would have daughters Kathe Becton and French Ana equally designated.

## 2020-10-25 NOTE — Assessment & Plan Note (Signed)
Continue as needed maxalt.  No recent flares, since retiring.  Doing well.  She has med to use if needed.  She will update me as needed.

## 2021-01-30 ENCOUNTER — Telehealth: Payer: Self-pay

## 2021-01-30 NOTE — Telephone Encounter (Signed)
Patient came in to office today wanting to schedule appointment to have Shingrix vaccine. Please advise if ok to give and will call to schedule NV.

## 2021-01-31 NOTE — Telephone Encounter (Signed)
NV scheduled for 02/06/21 at 10:30 am 

## 2021-01-31 NOTE — Telephone Encounter (Signed)
Should be fine to do.  Please schedule.  Thanks. 

## 2021-02-06 ENCOUNTER — Other Ambulatory Visit: Payer: Self-pay

## 2021-02-06 ENCOUNTER — Ambulatory Visit: Payer: Medicare Other

## 2021-04-04 ENCOUNTER — Telehealth: Payer: Self-pay | Admitting: *Deleted

## 2021-04-04 DIAGNOSIS — Z1211 Encounter for screening for malignant neoplasm of colon: Secondary | ICD-10-CM

## 2021-04-04 NOTE — Telephone Encounter (Signed)
-----   Message from Joaquim Nam, MD sent at 04/01/2021  5:53 PM EDT ----- Regarding: FW: needs repeat cologuard 03/2021 Please send an order for Cologuard.  Thanks.  Clelia Croft  ----- Message ----- From: Joaquim Nam, MD Sent: 03/23/2021  12:00 AM EDT To: Joaquim Nam, MD Subject: needs repeat cologuard 03/2021                  Need to order.

## 2021-04-04 NOTE — Telephone Encounter (Signed)
Cologuard ordered as instructed by Dr. Duncan. 

## 2021-04-09 ENCOUNTER — Telehealth: Payer: Self-pay | Admitting: *Deleted

## 2021-04-09 NOTE — Telephone Encounter (Signed)
Patient left a voicemail stating that she just received a colorguard in the mail and wants to know if Dr. Para March feels that she needs to do it.

## 2021-04-09 NOTE — Telephone Encounter (Signed)
Patient advised to proceed to cologuard and patient agrees to do so.

## 2021-04-09 NOTE — Telephone Encounter (Signed)
Yes, would proceed.  Thanks.

## 2021-04-10 DIAGNOSIS — Z1211 Encounter for screening for malignant neoplasm of colon: Secondary | ICD-10-CM | POA: Diagnosis not present

## 2021-04-14 LAB — COLOGUARD: Cologuard: NEGATIVE

## 2021-05-07 ENCOUNTER — Other Ambulatory Visit: Payer: Self-pay | Admitting: Family Medicine

## 2021-05-07 ENCOUNTER — Telehealth: Payer: Self-pay | Admitting: Family Medicine

## 2021-05-07 DIAGNOSIS — Z1231 Encounter for screening mammogram for malignant neoplasm of breast: Secondary | ICD-10-CM

## 2021-05-07 NOTE — Telephone Encounter (Signed)
Pt called to schedule a mammogram [facility] needs an order placed

## 2021-05-08 NOTE — Telephone Encounter (Signed)
Done. Thanks.

## 2021-05-08 NOTE — Telephone Encounter (Signed)
Patient has been notified MM order is in. She will call to schedule her appt.

## 2021-05-08 NOTE — Addendum Note (Signed)
Addended by: Joaquim Nam on: 05/08/2021 08:00 AM   Modules accepted: Orders

## 2021-05-16 DIAGNOSIS — H2513 Age-related nuclear cataract, bilateral: Secondary | ICD-10-CM | POA: Diagnosis not present

## 2021-05-23 ENCOUNTER — Other Ambulatory Visit: Payer: Self-pay

## 2021-05-23 ENCOUNTER — Ambulatory Visit
Admission: RE | Admit: 2021-05-23 | Discharge: 2021-05-23 | Disposition: A | Discharge status: Home / Self Care | Payer: Medicare Other | Source: Ambulatory Visit | Attending: Family Medicine | Admitting: Family Medicine

## 2021-05-23 DIAGNOSIS — Z1231 Encounter for screening mammogram for malignant neoplasm of breast: Secondary | ICD-10-CM | POA: Diagnosis not present

## 2021-09-16 ENCOUNTER — Encounter: Payer: Self-pay | Admitting: Emergency Medicine

## 2021-09-16 ENCOUNTER — Other Ambulatory Visit: Payer: Self-pay

## 2021-09-16 ENCOUNTER — Emergency Department: Payer: Medicare Other

## 2021-09-16 ENCOUNTER — Emergency Department
Admission: EM | Admit: 2021-09-16 | Discharge: 2021-09-16 | Disposition: A | Discharge status: Home / Self Care | Payer: Medicare Other | Attending: Emergency Medicine | Admitting: Emergency Medicine

## 2021-09-16 DIAGNOSIS — R079 Chest pain, unspecified: Secondary | ICD-10-CM | POA: Diagnosis not present

## 2021-09-16 DIAGNOSIS — Z87891 Personal history of nicotine dependence: Secondary | ICD-10-CM | POA: Diagnosis not present

## 2021-09-16 DIAGNOSIS — U071 COVID-19: Secondary | ICD-10-CM | POA: Insufficient documentation

## 2021-09-16 DIAGNOSIS — R0602 Shortness of breath: Secondary | ICD-10-CM | POA: Diagnosis not present

## 2021-09-16 DIAGNOSIS — Z79899 Other long term (current) drug therapy: Secondary | ICD-10-CM | POA: Insufficient documentation

## 2021-09-16 LAB — BASIC METABOLIC PANEL
Anion gap: 7 (ref 5–15)
BUN: 16 mg/dL (ref 8–23)
CO2: 23 mmol/L (ref 22–32)
Calcium: 8.7 mg/dL — ABNORMAL LOW (ref 8.9–10.3)
Chloride: 102 mmol/L (ref 98–111)
Creatinine, Ser: 1.01 mg/dL — ABNORMAL HIGH (ref 0.44–1.00)
GFR, Estimated: 58 mL/min — ABNORMAL LOW (ref >60)
Glucose, Bld: 117 mg/dL — ABNORMAL HIGH (ref 70–99)
Potassium: 3.5 mmol/L (ref 3.5–5.1)
Sodium: 132 mmol/L — ABNORMAL LOW (ref 135–145)

## 2021-09-16 LAB — CBC
HCT: 38.9 % (ref 36.0–46.0)
Hemoglobin: 13.5 g/dL (ref 12.0–15.0)
MCH: 28.9 pg (ref 26.0–34.0)
MCHC: 34.7 g/dL (ref 30.0–36.0)
MCV: 83.3 fL (ref 80.0–100.0)
Platelets: 161 10*3/uL (ref 150–400)
RBC: 4.67 MIL/uL (ref 3.87–5.11)
RDW: 12.2 % (ref 11.5–15.5)
WBC: 4.7 10*3/uL (ref 4.0–10.5)
nRBC: 0 % (ref 0.0–0.2)

## 2021-09-16 LAB — RESP PANEL BY RT-PCR (FLU A&B, COVID) ARPGX2
Influenza A by PCR: NEGATIVE
Influenza B by PCR: NEGATIVE
SARS Coronavirus 2 by RT PCR: POSITIVE — AB

## 2021-09-16 LAB — TROPONIN I (HIGH SENSITIVITY): Troponin I (High Sensitivity): 13 ng/L (ref <18)

## 2021-09-16 MED ORDER — FAMOTIDINE 20 MG PO TABS
40.0000 mg | ORAL_TABLET | Freq: Once | ORAL | Status: AC
  Administered 2021-09-16: 13:00:00 40 mg via ORAL
  Filled 2021-09-16: qty 2

## 2021-09-16 MED ORDER — PAXLOVID (300/100) 20 X 150 MG & 10 X 100MG PO TBPK: 3.0000 | ORAL_TABLET | Freq: Two times a day (BID) | ORAL | 0 refills | Status: AC

## 2021-09-16 MED ORDER — ONDANSETRON 4 MG PO TBDP: 4.0000 mg | ORAL_TABLET | Freq: Three times a day (TID) | ORAL | 0 refills | Status: DC | PRN

## 2021-09-16 MED ORDER — IBUPROFEN 200 MG PO TABS: 600.0000 mg | ORAL_TABLET | Freq: Four times a day (QID) | ORAL | 0 refills | Status: DC | PRN

## 2021-09-16 MED ORDER — FAMOTIDINE 20 MG PO TABS: 20.0000 mg | ORAL_TABLET | Freq: Two times a day (BID) | ORAL | 0 refills | Status: DC

## 2021-09-16 MED ORDER — ALUM & MAG HYDROXIDE-SIMETH 200-200-20 MG/5ML PO SUSP
30.0000 mL | Freq: Once | ORAL | Status: AC
  Administered 2021-09-16: 13:00:00 30 mL via ORAL
  Filled 2021-09-16: qty 30

## 2021-09-16 MED ORDER — ONDANSETRON 8 MG PO TBDP
8.0000 mg | ORAL_TABLET | Freq: Once | ORAL | Status: AC
  Administered 2021-09-16: 13:00:00 8 mg via ORAL
  Filled 2021-09-16: qty 1

## 2021-09-16 NOTE — ED Provider Notes (Signed)
[facility] Emergency Department Provider Note  ____________________________________________  Time seen: Approximately 2:47 PM  I have reviewed the triage vital signs and the nursing notes.   HISTORY  Chief Complaint Shortness of Breath and Chest Pain    HPI Rebecca Moran is a 75 y.o. female with a past history of hyperlipidemia and seasonal allergies who comes ED complaining of 2 days of generalized headache, malaise fatigue nonproductive cough and body aches.  She has decreased appetite and decreased oral intake.  No vomiting or diarrhea.  Normal bowel movements.  She is vaccinated against COVID.  She does report mild shortness of breath which is worse with walking but she is able to walk all the way to the mailbox and back.  No chest pain.    Past Medical History:  Diagnosis Date   Allergy    seasonal   Family history of cancer    Headache    Hearing loss    in L ear   History of migraines    MRI -head,secondary H?A L_OCCIP within normal limits 07/1999//CT of head without Normal ([facility]) 12/21/2007   Hyperlipidemia    NSVD (normal spontaneous vaginal delivery)    x 2   Osteoporosis      Patient Active Problem List   Diagnosis Date Noted   Dysfunction of eustachian tube 11/22/2018   Carotid artery calcification 08/12/2018   Chest pain 10/18/2017   Healthcare maintenance 06/25/2016   Dysphagia 06/25/2016   Advance care planning 12/12/2014   Knee pain 10/31/2013   Osteoporosis 07/22/2012   Hard of hearing 07/05/2012   Medicare annual wellness visit, subsequent 07/05/2012   Hypercholesteremia 03/29/2011   Elevated BP without diagnosis of hypertension 12/21/2007   ALLERGIC RHINITIS, SEASONAL 03/24/2007   Migraine 03/24/2007     Past Surgical History:  Procedure Laterality Date   ABDOMINAL HYSTERECTOMY  11/99   partial hysterectomy ovaries intact (fibroids   COLONOSCOPY WITH PROPOFOL N/A 01/13/2018   Procedure: COLONOSCOPY WITH  PROPOFOL;  Surgeon: [name], MD;  Location: [facility] ENDOSCOPY;  Service: Endoscopy;  Laterality: N/A;   ESOPHAGOGASTRODUODENOSCOPY (EGD) WITH PROPOFOL N/A 01/13/2018   Procedure: ESOPHAGOGASTRODUODENOSCOPY (EGD) WITH PROPOFOL;  Surgeon: [name], MD;  Location: [facility] ENDOSCOPY;  Service: Endoscopy;  Laterality: N/A;   normal vaginal delivery x2 [facility] hospital     stress cardiolite  04/28/2001   stress cardiolite wnl, EF 69%   TUBAL LIGATION  1969     Prior to Admission medications   Medication Sig Start Date End Date Taking? Authorizing Provider  famotidine (PEPCID) 20 MG tablet Take 1 tablet (20 mg total) by mouth 2 (two) times daily. 09/16/21  Yes Sharman Cheek, MD  ibuprofen (MOTRIN IB) 200 MG tablet Take 3 tablets (600 mg total) by mouth every 6 (six) hours as needed. 09/16/21  Yes Sharman Cheek, MD  nirmatrelvir & ritonavir (PAXLOVID, 300/100,) 20 x 150 MG & 10 x 100MG  TBPK Take 3 tablets by mouth in the morning and at bedtime for 5 days. Follow package directions 09/16/21 09/21/21 Yes 09/23/21, MD  ondansetron (ZOFRAN-ODT) 4 MG disintegrating tablet Take 1 tablet (4 mg total) by mouth every 8 (eight) hours as needed for nausea or vomiting. 09/16/21  Yes 09/18/21, MD  cetirizine (ZYRTEC) 10 MG tablet Take 10 mg by mouth daily.    [provider]  Cholecalciferol ([employer] VITAMIN D3) 25 MCG (1000 UT) capsule Take 1 capsule (1,000 Units total) by mouth every Monday, Wednesday, and Friday. 06/14/19  Joaquim Nam, MD  fluticasone [facility]) 50 MCG/ACT nasal spray Place 2 sprays into both nostrils as needed.     [provider]  pantoprazole (PROTONIX) 40 MG tablet TAKE 1 TABLET(40 MG) BY MOUTH DAILY 10/23/20   Joaquim Nam, MD  pravastatin (PRAVACHOL) 40 MG tablet TAKE 1 TABLET BY MOUTH EVERY DAY. 10/23/20   Joaquim Nam, MD  rizatriptan (MAXALT-MLT) 10 MG disintegrating tablet Take 1 tablet (10 mg total) by mouth as  needed. May repeat in 2 hours if needed 10/23/20   Joaquim Nam, MD  vitamin C (ASCORBIC ACID) 500 MG tablet Take 500 mg by mouth daily.    [provider]     Allergies Crestor [rosuvastatin calcium], Fosamax [alendronate sodium], Oxycodone-acetaminophen, Prednisone, and Sumatriptan   Family History  Problem Relation Age of Onset   Heart disease Mother 66       CHF   Cancer Mother 56       lung cancer since her 2's with kidney cancer //cancer of the throat and tongue //never smoked or drank   Diabetes Mother        borderline   Cancer Father 52       melanoma   Cancer Sister        breast cancer and ovarian   Breast cancer Sister    Ovarian cancer Sister    Bone cancer Sister    Heart disease Paternal Grandfather        MI   Diabetes Brother    Lung cancer Brother    Stroke Maternal Grandmother    Depression Maternal Grandfather    Colon cancer Neg Hx     Social History Social History   Tobacco Use   Smoking status: Former    Packs/day: 2.00    Years: 22.00    Pack years: 44.00    Types: Cigarettes    Quit date: 09/23/1981    Years since quitting: 40.0   Smokeless tobacco: Never   Tobacco comments:    quit in 1975  Vaping Use   Vaping Use: Never used  Substance Use Topics   Alcohol use: No   Drug use: No    Review of Systems  Constitutional:   No fever positive chills.  ENT:   No sore throat. No rhinorrhea. Cardiovascular:   No chest pain or syncope. Respiratory:   No dyspnea positive cough. Gastrointestinal:   Negative for abdominal pain, vomiting and diarrhea.  Positive for decreased appetite Musculoskeletal:   Negative for focal pain or swelling All other systems reviewed and are negative except as documented above in ROS and HPI.  ____________________________________________   PHYSICAL EXAM:  VITAL SIGNS: ED Triage Vitals  Enc Vitals Group     BP 09/16/21 1044 (!) 155/90     Pulse Rate 09/16/21 1039 75     Resp 09/16/21 1039 20      Temp 09/16/21 1039 98.5 F (36.9 C)     Temp src --      SpO2 09/16/21 1039 94 %     Weight 09/16/21 1036 189 lb (85.7 kg)     Height 09/16/21 1036  (1.676 m)     Head Circumference --      Peak Flow --      Pain Score --      Pain Loc --      Pain Edu? --      Excl. in GC? --     Vital signs reviewed, nursing  assessments reviewed.   Constitutional:   Alert and oriented. Non-toxic appearance. Eyes:   Conjunctivae are normal. EOMI. PERRL. ENT      Head:   Normocephalic and atraumatic.      Nose:   Normal      Mouth/Throat:   Normal, moist mucosa      Neck:   No meningismus. Full ROM. Hematological/Lymphatic/Immunilogical:   No cervical lymphadenopathy. Cardiovascular:   RRR. Symmetric bilateral radial and DP pulses.  No murmurs. Cap refill less than 2 seconds. Respiratory:   Normal respiratory effort without tachypnea/retractions. Breath sounds are clear and equal bilaterally. No wheezes/rales/rhonchi. Gastrointestinal:   Soft and nontender. Non distended. There is no CVA tenderness.  No rebound, rigidity, or guarding. Genitourinary:   deferred Musculoskeletal:   Normal range of motion in all extremities. No joint effusions.  No lower extremity tenderness.  No edema. Neurologic:   Normal speech and language.  Motor grossly intact. No acute focal neurologic deficits are appreciated.  Skin:    Skin is warm, dry and intact. No rash noted.  No petechiae, purpura, or bullae.  ____________________________________________    LABS (pertinent positives/negatives) (all labs ordered are listed, but only abnormal results are displayed) Labs Reviewed  RESP PANEL BY RT-PCR (FLU A&B, COVID) ARPGX2 - Abnormal; Notable for the following components:      Result Value   SARS Coronavirus 2 by RT PCR POSITIVE (*)    All other components within normal limits  BASIC METABOLIC PANEL - Abnormal; Notable for the following components:   Sodium 132 (*)    Glucose, Bld 117 (*)     Creatinine, Ser 1.01 (*)    Calcium 8.7 (*)    GFR, Estimated 58 (*)    All other components within normal limits  CBC  TROPONIN I (HIGH SENSITIVITY)   ____________________________________________   EKG    ____________________________________________    RADIOLOGY  DG Chest 2 View  Result Date: 09/16/2021 CLINICAL DATA:  Chest pain, shortness of breath EXAM: CHEST - 2 VIEW COMPARISON:  10/18/2017 FINDINGS: The heart size and mediastinal contours are within normal limits. Mildly increased interstitial markings bilaterally. No lobar consolidation. No pleural effusion or pneumothorax. The visualized skeletal structures are unremarkable. IMPRESSION: Mildly increased interstitial markings bilaterally which may reflect bronchitic type lung changes, developing viral infection, versus early edema. Electronically Signed   By: Duanne Guess D.O.   On: 09/16/2021 10:55    ____________________________________________   PROCEDURES Procedures  ____________________________________________    CLINICAL IMPRESSION / ASSESSMENT AND PLAN / ED COURSE  Medications ordered in the ED: Medications  ondansetron (ZOFRAN-ODT) disintegrating tablet 8 mg (8 mg Oral Given 09/16/21 1322)  famotidine (PEPCID) tablet 40 mg (40 mg Oral Given 09/16/21 1322)  alum & mag hydroxide-simeth (MAALOX/MYLANTA) 200-200-20 MG/5ML suspension 30 mL (30 mLs Oral Given 09/16/21 1322)    Pertinent labs & imaging results that were available during my care of the patient were reviewed by me and considered in my medical decision making (see chart for details).  RYENNE Moran was evaluated in Emergency Department on 09/16/2021 for the symptoms described in the history of present illness. She was evaluated in the context of the global COVID-19 pandemic, which necessitated consideration that the patient might be at risk for infection with the SARS-CoV-2 virus that causes COVID-19. Institutional protocols and algorithms  that pertain to the evaluation of patients at risk for COVID-19 are in a state of rapid change based on information released by regulatory bodies including the CDC  and federal and state organizations. These policies and algorithms were followed during the patient's care in the ED.   Patient presents with viral syndrome.  Chest x-ray consistent with a viral pattern, labs unremarkable.  COVID-positive.  She is ambulatory with good energy, tolerating p.o.  Counseled patient extensively on the importance of wearing a mask at all times especially since she shares a home with her elderly husband.  Prescribe Zofran and Paxlovid.  Counseled her to hold her pravastatin for the next week.  Return precautions discussed.      ____________________________________________   FINAL CLINICAL IMPRESSION(S) / ED DIAGNOSES    Final diagnoses:  COVID-19 virus infection     ED Discharge Orders          Ordered    nirmatrelvir & ritonavir (PAXLOVID, 300/100,) 20 x 150 MG & 10 x 100MG  TBPK  2 times daily        09/16/21 1445    ondansetron (ZOFRAN-ODT) 4 MG disintegrating tablet  Every 8 hours PRN        09/16/21 1445    famotidine (PEPCID) 20 MG tablet  2 times daily        09/16/21 1445    ibuprofen (MOTRIN IB) 200 MG tablet  Every 6 hours PRN        09/16/21 1445            Portions of this note were generated with dragon dictation software. Dictation errors may occur despite best attempts at proofreading.    09/18/21, MD 09/16/21 [phone]

## 2021-09-16 NOTE — ED Triage Notes (Signed)
Pt via POV from home. Pt c/o cough, nausea, headache, SOB, and R-sided chest tightness since yesterday. Pt states she has SOB on exertion. Denies hx CHF/COPD. Pt is A&Ox4 and NAD.

## 2021-09-16 NOTE — ED Notes (Signed)
Called lab because EDP needs results covid and flu before discharge. Collected at 1039. They will call back.

## 2021-09-16 NOTE — Discharge Instructions (Signed)
Your COVID test is positive today.  Your other tests are reassuring.  Follow the enclosed instructions.  Quarantine yourself at home for the next 8 days and avoid contact with other people.  You should wear a mask at all times when indoors if you will be sharing a residence with others during this quarantine.

## 2021-09-28 ENCOUNTER — Other Ambulatory Visit: Payer: Self-pay

## 2021-09-28 ENCOUNTER — Ambulatory Visit (INDEPENDENT_AMBULATORY_CARE_PROVIDER_SITE_OTHER): Payer: Medicare Other | Admitting: Family Medicine

## 2021-09-28 ENCOUNTER — Encounter: Payer: Self-pay | Admitting: Family Medicine

## 2021-09-28 VITALS — BP 110/62 | HR 58 | Temp 98.2°F | Ht 66.0 in | Wt 184.0 lb

## 2021-09-28 DIAGNOSIS — I4891 Unspecified atrial fibrillation: Secondary | ICD-10-CM

## 2021-09-28 DIAGNOSIS — U071 COVID-19: Secondary | ICD-10-CM

## 2021-09-28 DIAGNOSIS — R0989 Other specified symptoms and signs involving the circulatory and respiratory systems: Secondary | ICD-10-CM

## 2021-09-28 LAB — CBC WITH DIFFERENTIAL/PLATELET
Basophils Absolute: 0 10*3/uL (ref 0.0–0.1)
Basophils Relative: 0.5 % (ref 0.0–3.0)
Eosinophils Absolute: 0.1 10*3/uL (ref 0.0–0.7)
Eosinophils Relative: 1 % (ref 0.0–5.0)
HCT: 40.7 % (ref 36.0–46.0)
Hemoglobin: 13.5 g/dL (ref 12.0–15.0)
Lymphocytes Relative: 33.8 % (ref 12.0–46.0)
Lymphs Abs: 2.1 10*3/uL (ref 0.7–4.0)
MCHC: 33.2 g/dL (ref 30.0–36.0)
MCV: 84.1 fl (ref 78.0–100.0)
Monocytes Absolute: 0.6 10*3/uL (ref 0.1–1.0)
Monocytes Relative: 10.1 % (ref 3.0–12.0)
Neutro Abs: 3.4 10*3/uL (ref 1.4–7.7)
Neutrophils Relative %: 54.6 % (ref 43.0–77.0)
Platelets: 240 10*3/uL (ref 150.0–400.0)
RBC: 4.84 Mil/uL (ref 3.87–5.11)
RDW: 13.1 % (ref 11.5–15.5)
WBC: 6.2 10*3/uL (ref 4.0–10.5)

## 2021-09-28 LAB — COMPREHENSIVE METABOLIC PANEL
ALT: 31 U/L (ref 0–35)
AST: 24 U/L (ref 0–37)
Albumin: 3.8 g/dL (ref 3.5–5.2)
Alkaline Phosphatase: 56 U/L (ref 39–117)
BUN: 16 mg/dL (ref 6–23)
CO2: 31 mEq/L (ref 19–32)
Calcium: 9.4 mg/dL (ref 8.4–10.5)
Chloride: 104 mEq/L (ref 96–112)
Creatinine, Ser: 1.12 mg/dL (ref 0.40–1.20)
GFR: 48.09 mL/min — ABNORMAL LOW (ref >60.00)
Glucose, Bld: 88 mg/dL (ref 70–99)
Potassium: 3.7 mEq/L (ref 3.5–5.1)
Sodium: 144 mEq/L (ref 135–145)
Total Bilirubin: 1.1 mg/dL (ref 0.2–1.2)
Total Protein: 6.1 g/dL (ref 6.0–8.3)

## 2021-09-28 LAB — TSH: TSH: 1.59 u[IU]/mL (ref 0.35–5.50)

## 2021-09-28 MED ORDER — APIXABAN 5 MG PO TABS: 5.0000 mg | ORAL_TABLET | Freq: Two times a day (BID) | ORAL | 3 refills | Status: DC

## 2021-09-28 MED ORDER — METOPROLOL SUCCINATE ER 25 MG PO TB24: 25.0000 mg | ORAL_TABLET | Freq: Every day | ORAL | 3 refills | Status: DC

## 2021-09-28 NOTE — Progress Notes (Signed)
This visit occurred during the SARS-CoV-2 public health emergency.  Safety protocols were in place, including screening questions prior to the visit, additional usage of staff PPE, and extensive cleaning of exam room while observing appropriate contact time as indicated for disinfecting solutions.  Here today with her daughter.  She was at ER with covid.  She had ear pain prev.  She has had variable pressure in the ears.  She had sensation of lightheadedness upon standing.  It would gradually resolve.  Overall that is resolved over the last 2 days.    She took paxlovid but had constipation.  Could take miralax if needed, d/w pt.    Breathing is clearly better.  Can take a deep breath.  No fevers, no chills.  No sputum.  Taste is still abnormal but not absent.    Meds, vitals, and allergies reviewed.   ROS: Per HPI unless specifically indicated in ROS section   GEN: nad, alert and oriented HEENT: ncat,  L ETD with valsalva.  TM wnl B o/w.   NECK: supple w/o LA CV: IRR, tachy.  PULM: ctab, no inc wob ABD: soft, +bs EXT: no edema SKIN: Well-perfused.  45 minutes were devoted to patient care in this encounter (this includes time spent reviewing the patient's file/history, interviewing and examining the patient, counseling/reviewing plan with patient).

## 2021-09-28 NOTE — Patient Instructions (Addendum)
If needed, stop famotidine but continue pantoprazole.    Go to the lab on the way out.   If you have mychart we'll likely use that to update you.     Atrial fibrillation.  Start eliquis.  If any bleeding, then stop it and let me know.  We'll call about seeing cardiology.  If any chest pain or heart racing, then go to the ER.  Start metoprolol 25mg  a day.  Drink enough fluid to keep your urine clear.   Take care.  Glad to see you.

## 2021-09-30 DIAGNOSIS — U071 COVID-19: Secondary | ICD-10-CM | POA: Insufficient documentation

## 2021-09-30 DIAGNOSIS — I4891 Unspecified atrial fibrillation: Secondary | ICD-10-CM | POA: Insufficient documentation

## 2021-09-30 NOTE — Assessment & Plan Note (Addendum)
New diagnosis.  I presume this was at least partially related to COVID infection.  Discussed.  Discussed rationale for treatment and pathophysiology of atrial fibrillation.  Start metoprolol 25 mg.  Make sure to have adequate fluid intake.  Start Eliquis, given her age and overall stroke risk.  Rationale and routine cautions regarding anticoagulation discussed with patient.  Refer to cardiology.  Routine ER cautions given.  She agrees with plan.  See notes on labs.

## 2021-09-30 NOTE — Assessment & Plan Note (Signed)
Clearly better in the meantime but with residual eustachian tube dysfunction and apparently new onset atrial fibrillation.  Her lungs are clear and she is okay for outpatient follow-up.  She will update me as needed.

## 2021-10-04 ENCOUNTER — Telehealth: Payer: Self-pay | Admitting: Family Medicine

## 2021-10-04 ENCOUNTER — Other Ambulatory Visit: Payer: Self-pay | Admitting: Family Medicine

## 2021-10-04 DIAGNOSIS — I4891 Unspecified atrial fibrillation: Secondary | ICD-10-CM

## 2021-10-04 MED ORDER — METOPROLOL SUCCINATE ER 25 MG PO TB24: 12.5000 mg | ORAL_TABLET | Freq: Every day | ORAL | Status: DC

## 2021-10-04 NOTE — Telephone Encounter (Signed)
Spoke with patient. See result note.  

## 2021-10-04 NOTE — Telephone Encounter (Signed)
Pt returned Jessica's call this morning, stated she is feeling fine... just still tired when she exerts herself. I let her know Shanda Bumps will call her back and that Dr Para March wants her to see the Cardiologist still.

## 2021-10-11 DIAGNOSIS — R0602 Shortness of breath: Secondary | ICD-10-CM | POA: Diagnosis not present

## 2021-10-11 DIAGNOSIS — R079 Chest pain, unspecified: Secondary | ICD-10-CM | POA: Diagnosis not present

## 2021-10-11 DIAGNOSIS — I480 Paroxysmal atrial fibrillation: Secondary | ICD-10-CM | POA: Diagnosis not present

## 2021-10-13 ENCOUNTER — Other Ambulatory Visit: Payer: Self-pay | Admitting: Family Medicine

## 2021-10-19 NOTE — Progress Notes (Deleted)
Subjective:   Rebecca Moran is a 76 y.o. female who presents for Medicare Annual (Subsequent) preventive examination.  I connected with Alex Mcmanigal today by telephone and verified that I am speaking with the correct person using two identifiers. Location patient: home Location provider: work Persons participating in the virtual visit: patient, Engineer, civil (consulting).    I discussed the limitations, risks, security and privacy concerns of performing an evaluation and management service by telephone and the availability of in person appointments. I also discussed with the patient that there may be a patient responsible charge related to this service. The patient expressed understanding and verbally consented to this telephonic visit.    Interactive audio and video telecommunications were attempted between this provider and patient, however failed, due to patient having technical difficulties OR patient did not have access to video capability.  We continued and completed visit with audio only.  Some vital signs may be absent or patient reported.   Time Spent with patient on telephone encounter: *** minutes  Review of Systems           Objective:    There were no vitals filed for this visit. There is no height or weight on file to calculate BMI.  Advanced Directives 09/16/2021 10/20/2020 08/04/2018 06/02/2018 01/13/2018 10/18/2017 06/13/2016  Does Patient Have a Medical Advance Directive? No No No No No No No  Would patient like information on creating a medical advance directive? - No - Patient declined No - Patient declined No - Patient declined No - Patient declined No - Patient declined Yes - Educational materials given    Current Medications (verified) Outpatient Encounter Medications as of 10/22/2021  Medication Sig   apixaban (ELIQUIS) 5 MG TABS tablet Take 1 tablet (5 mg total) by mouth 2 (two) times daily.   cetirizine (ZYRTEC) 10 MG tablet Take 10 mg by mouth daily.    Cholecalciferol ([employer] VITAMIN D3) 25 MCG (1000 UT) capsule Take 1 capsule (1,000 Units total) by mouth every Monday, Wednesday, and Friday.   famotidine (PEPCID) 20 MG tablet Take 1 tablet (20 mg total) by mouth 2 (two) times daily.   fluticasone (FLONASE) 50 MCG/ACT nasal spray Place 2 sprays into both nostrils as needed.    metoprolol succinate (TOPROL-XL) 25 MG 24 hr tablet Take 0.5-1 tablets (12.5-25 mg total) by mouth daily.   pantoprazole (PROTONIX) 40 MG tablet TAKE 1 TABLET(40 MG) BY MOUTH DAILY   pravastatin (PRAVACHOL) 40 MG tablet TAKE 1 TABLET BY MOUTH EVERY DAY   rizatriptan (MAXALT-MLT) 10 MG disintegrating tablet Take 1 tablet (10 mg total) by mouth as needed. May repeat in 2 hours if needed   vitamin C (ASCORBIC ACID) 500 MG tablet Take 500 mg by mouth daily.   No facility-administered encounter medications on file as of 10/22/2021.    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   History of migraines    MRI -head,secondary H?A L_OCCIP within normal limits 07/1999//CT of head without Normal [facility]) 12/21/2007   Hyperlipidemia    NSVD (normal spontaneous vaginal delivery)    x 2   Osteoporosis    Past Surgical History:  Procedure Laterality Date   ABDOMINAL HYSTERECTOMY  11/99   partial hysterectomy ovaries intact (fibroids   COLONOSCOPY  WITH PROPOFOL N/A 01/13/2018   Procedure: COLONOSCOPY WITH PROPOFOL;  Surgeon: Christena DeemSkulskie, Martin U, MD;  [facility];  Service: Endoscopy;  Laterality: N/A;   ESOPHAGOGASTRODUODENOSCOPY (EGD) WITH PROPOFOL N/A 01/13/2018   Procedure: ESOPHAGOGASTRODUODENOSCOPY (EGD) WITH PROPOFOL;  Surgeon: Christena DeemSkulskie, Martin U, MD;  [facility];  Service: Endoscopy;  Laterality: N/A;   normal vaginal  delivery x2 [facility]     stress cardiolite  04/28/2001   stress cardiolite wnl, EF 69%   TUBAL LIGATION  1969   Family History  Problem Relation Age of Onset   Heart disease Mother 7967       CHF   Cancer Mother 2250       lung cancer since her 6550's with kidney cancer //cancer of the throat and tongue //never smoked or drank   Diabetes Mother        borderline   Cancer Father 7376       melanoma   Cancer Sister        breast cancer and ovarian   Breast cancer Sister    Ovarian cancer Sister    Bone cancer Sister    Heart disease Paternal Grandfather        MI   Diabetes Brother    Lung cancer Brother    Stroke Maternal Grandmother    Depression Maternal Grandfather    Colon cancer Neg Hx    Social History   Socioeconomic History   Marital status: Married    Spouse name: Not on file   Number of children: 2   Years of education: Not on file   Highest education level: Not on file  Occupational History   Occupation: Retired    Associate Professormployer: RETIRED    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   Retired.   Social Determinants of Health   Financial Resource Strain: Low Risk    Difficulty of Paying Living Expenses: Not hard at all  Food Insecurity: No Food Insecurity   Worried About Programme researcher, broadcasting/film/videounning Out of Food in the Last Year: Never true   Ran Out of Food in the Last Year: Never true  Transportation Needs: No Transportation Needs   Lack of Transportation (Medical): No    Lack of Transportation (Non-Medical): No  Physical Activity: Inactive   Days of Exercise per Week: 0 days   Minutes of Exercise per Session: 0 min  Stress: No Stress Concern Present   Feeling of Stress : Not at all  Social Connections: Not on file    Tobacco Counseling Counseling given: Not Answered Tobacco comments: quit in 1975   Clinical Intake:                 Diabetic? No         Activities of Daily Living In your present state of health, do you have  any difficulty performing the following activities: 10/20/2020  Hearing? Y  Comment deaf in left ear  Vision? N  Difficulty concentrating or making decisions? N  Walking or climbing stairs? N  Dressing or bathing? N  Doing errands, shopping? N  Preparing Food and eating ? N  Using the Toilet? N  In the past six months, have you accidently leaked urine? N  Do you have problems with loss of bowel control? N  Managing your Medications? N  Managing your Finances? N  Housekeeping or managing your Housekeeping? N  Some recent data might be hidden    Patient Care Team: Joaquim Namuncan, Graham S, MD as PCP - General (Family Medicine) [place] OuchArida, Muhammad A, MD as PCP - Cardiology (Cardiology)  Indicate any recent Medical Services you may have received from other than [facility] providers in the past year (date may be approximate).     Assessment:   This is a routine wellness examination for Rebecca Moran.  Hearing/Vision screen No results found.  Dietary issues and exercise activities discussed:     Goals Addressed   None    Depression Screen PHQ 2/9 Scores 10/20/2020 06/14/2019 06/02/2018 02/20/2018 06/13/2016 12/09/2014  PHQ - 2 Score 0 0 0 0 0 0  PHQ- 9 Score 0 - 0 - - -    Fall Risk Fall Risk  10/20/2020 06/14/2019 06/02/2018 02/20/2018 06/13/2016  Falls in the past year? 0 0 No No No  Number falls in past yr: 0 - - - -  Injury with Fall? 0 - - - -  Risk for fall due to : Medication side effect - - - -  Follow up Falls  prevention discussed;Falls evaluation completed - - - -    FALL RISK PREVENTION PERTAINING TO THE HOME:  Any stairs in or around the home? {YES/NO:21197} If so, are there any without handrails? {YES/NO:21197} Home free of loose throw rugs in walkways, pet beds, electrical cords, etc? {YES/NO:21197} Adequate lighting in your home to reduce risk of falls? {YES/NO:21197}  ASSISTIVE DEVICES UTILIZED TO PREVENT FALLS:  Life alert? {YES/NO:21197} Use of a cane, walker or w/c? {YES/NO:21197} Grab bars in the bathroom? {YES/NO:21197} Shower chair or bench in shower? {YES/NO:21197} Elevated toilet seat or a handicapped toilet? {YES/NO:21197}  TIMED UP AND GO:  Was the test performed? No .    Cognitive Function: MMSE - Mini Mental State Exam 10/20/2020 06/02/2018 06/13/2016  Not completed: Unable to complete - -  Orientation to time - 5 5  Orientation to Place - 5 5  Registration - 3 3  Attention/ Calculation - 0 0  Recall - 2 3  Recall-comments - unable to recall 1 of 3 words -  Language- name 2 objects - 0 0  Language- repeat - 1 1  Language- follow 3 step command - 3 3  Language- read & follow direction - 0 0  Write a sentence - 0 0  Copy design - 0 0  Total score - 19 20        Immunizations Immunization History  Administered Date(s) Administered   Influenza Split 07/03/2012, 06/25/2013   Influenza, High Dose Seasonal PF 06/09/2017, 05/28/2019   Influenza,inj,Quad PF,6+ Mos 06/09/2018, 05/28/2019   Influenza-Unspecified 06/17/2014, 05/07/2016, 06/09/2017, 06/09/2018, 05/30/2020   PFIZER(Purple Top)SARS-COV-2 Vaccination 11/07/2019, 11/30/2019, 08/29/2020   Pneumococcal Conjugate-13 05/07/2016   Pneumococcal Polysaccharide-23 10/29/2013   Td 10/11/1999   Tdap 03/29/2011, 08/04/2018    TDAP status: Up to date  {Flu Vaccine status:2101806}  Pneumococcal vaccine status: Up to date  {  Covid-19 vaccine status:2101808}  Qualifies for Shingles Vaccine? Yes   Zostavax  completed No   {Shingrix Completed?:2101804}  Screening Tests Health Maintenance  Topic Date Due   Zoster Vaccines- Shingrix (1 of 2) Never done   COVID-19 Vaccine (4 - Booster for Pfizer series) 10/24/2020   INFLUENZA VACCINE  04/23/2021   Fecal DNA (Cologuard)  04/10/2024   TETANUS/TDAP  08/04/2028   Pneumonia Vaccine 66+ Years old  Completed   DEXA SCAN  Completed   Hepatitis C Screening  Completed   HPV VACCINES  Aged Out    Health Maintenance  Health Maintenance Due  Topic Date Due   Zoster Vaccines- Shingrix (1 of 2) Never done   COVID-19 Vaccine (4 - Booster for Pfizer series) 10/24/2020   INFLUENZA VACCINE  04/23/2021    Colorectal cancer screening: Type of screening: Cologuard. Completed 04/10/21. Repeat every 3 years  Mammogram status: Completed 05/23/21. Repeat every year  {Bone Density status:21018021}  Lung Cancer Screening: (Low Dose CT Chest recommended if Age 37-80 years, 30 pack-year currently smoking OR have quit w/in 15years.) does qualify. , DG chest completed 09/16/21    Additional Screening:  Hepatitis C Screening: does qualify; Completed 06/17/16  Vision Screening: Recommended annual ophthalmology exams for early detection of glaucoma and other disorders of the eye. Is the patient up to date with their annual eye exam?  {YES/NO:21197} Who is the provider or what is the name of the office in which the patient attends annual eye exams? *** If pt is not established with a provider, would they like to be referred to a provider to establish care? {YES/NO:21197}.   Dental Screening: Recommended annual dental exams for proper oral hygiene  Community Resource Referral / Chronic Care Management: CRR required this visit?  {YES/NO:21197}  CCM required this visit?  {YES/NO:21197}     Plan:     I have personally reviewed and noted the following in the patients chart:   Medical and social history Use of alcohol, tobacco or illicit drugs  Current  medications and supplements including opioid prescriptions.  Functional ability and status Nutritional status Physical activity Advanced directives List of other physicians Hospitalizations, surgeries, and ER visits in previous 12 months Vitals Screenings to include cognitive, depression, and falls Referrals and appointments  In addition, I have reviewed and discussed with patient certain preventive protocols, quality metrics, and best practice recommendations. A written personalized care plan for preventive services as well as general preventive health recommendations were provided to patient.   Due to this being a telephonic visit, the after visit summary with patients personalized plan was offered to patient via mail or my-chart. ***Patient declined at this time./ Patient would like to access on my-chart/ per request, patient was mailed a copy of AVS./ Patient preferred to pick up at office at next visit.   Janne Lab, LPN   3/71/0626   Nurse Health Advisor  Nurse Notes: none

## 2021-10-22 ENCOUNTER — Telehealth: Payer: Self-pay

## 2021-10-22 ENCOUNTER — Ambulatory Visit: Payer: Medicare Other

## 2021-10-22 NOTE — Telephone Encounter (Signed)
Made several attempts to contact patient in regards to annual wellness telephone visit for medicare today @ 12:00pm. Left vm message advising patient of appointment and to contact office to reschedule when available. TM

## 2021-10-22 NOTE — Telephone Encounter (Signed)
Pt returned call, she did not hear her phone, patient states please reschedule, the only dates in February she isn't available are the 7th and the 14th

## 2021-10-30 DIAGNOSIS — R079 Chest pain, unspecified: Secondary | ICD-10-CM | POA: Diagnosis not present

## 2021-10-30 DIAGNOSIS — R0602 Shortness of breath: Secondary | ICD-10-CM | POA: Diagnosis not present

## 2021-10-30 DIAGNOSIS — I480 Paroxysmal atrial fibrillation: Secondary | ICD-10-CM | POA: Diagnosis not present

## 2021-11-05 DIAGNOSIS — I480 Paroxysmal atrial fibrillation: Secondary | ICD-10-CM | POA: Diagnosis not present

## 2021-11-06 DIAGNOSIS — I480 Paroxysmal atrial fibrillation: Secondary | ICD-10-CM | POA: Diagnosis not present

## 2021-11-06 DIAGNOSIS — R0602 Shortness of breath: Secondary | ICD-10-CM | POA: Diagnosis not present

## 2021-11-06 DIAGNOSIS — Z23 Encounter for immunization: Secondary | ICD-10-CM | POA: Diagnosis not present

## 2021-12-06 NOTE — Progress Notes (Signed)
? ?Subjective:  ? Rebecca Moran is a 76 y.o. female who presents for Medicare Annual (Subsequent) preventive examination. ? ?I connected with Rebecca Moran today by telephone and verified that I am speaking with the correct person using two identifiers. ?Location patient: home ?Location provider: work ?Persons participating in the virtual visit: patient, nurse.  ?  ?I discussed the limitations, risks, security and privacy concerns of performing an evaluation and management service by telephone and the availability of in person appointments. I also discussed with the patient that there may be a patient responsible charge related to this service. The patient expressed understanding and verbally consented to this telephonic visit.  ?  ?Interactive audio and video telecommunications were attempted between this provider and patient, however failed, due to patient having technical difficulties OR patient did not have access to video capability.  We continued and completed visit with audio only. ? ?Some vital signs may be absent or patient reported.  ? ?Time Spent with patient on telephone encounter: 20 minutes ? ?Review of Systems    ? ?Cardiac Risk Factors include: advanced age (>7255men, 15>65 women) ? ?   ?Objective:  ?  ?Today's Vitals  ? 12/07/21 0943  ?Weight: 184 lb (83.5 kg)  ?Height: 5\' 6"  (1.676 m)  ? ?Body mass index is 29.7 kg/m?. ? ?Advanced Directives 12/07/2021 09/16/2021 10/20/2020 08/04/2018 06/02/2018 01/13/2018 10/18/2017  ?Does Patient Have a Medical Advance Directive? Yes No No No No No No  ?Type of Advance Directive Living will - - - - - -  ?Does patient want to make changes to medical advance directive? Yes (MAU/Ambulatory/Procedural Areas - Information given) - - - - - -  ?Would patient like information on creating a medical advance directive? - - No - Patient declined No - Patient declined No - Patient declined No - Patient declined No - Patient declined  ? ? ?Current Medications  (verified) ?Outpatient Encounter Medications as of 12/07/2021  ?Medication Sig  ? apixaban (ELIQUIS) 5 MG TABS tablet Take 1 tablet (5 mg total) by mouth 2 (two) times daily.  ? cetirizine (ZYRTEC) 10 MG tablet Take 10 mg by mouth daily.  ? Cholecalciferol ([employer]S VITAMIN D3) 25 MCG (1000 UT) capsule Take 1 capsule (1,000 Units total) by mouth every Monday, Wednesday, and Friday.  ? fluticasone (FLONASE) 50 MCG/ACT nasal spray Place 2 sprays into both nostrils as needed.   ? pantoprazole (PROTONIX) 40 MG tablet TAKE 1 TABLET(40 MG) BY MOUTH DAILY  ? pravastatin (PRAVACHOL) 40 MG tablet TAKE 1 TABLET BY MOUTH EVERY DAY  ? vitamin C (ASCORBIC ACID) 500 MG tablet Take 500 mg by mouth daily.  ? famotidine (PEPCID) 20 MG tablet Take 1 tablet (20 mg total) by mouth 2 (two) times daily. (Patient not taking: Reported on 12/07/2021)  ? metoprolol succinate (TOPROL-XL) 25 MG 24 hr tablet Take 0.5-1 tablets (12.5-25 mg total) by mouth daily. (Patient not taking: Reported on 12/07/2021)  ? rizatriptan (MAXALT-MLT) 10 MG disintegrating tablet Take 1 tablet (10 mg total) by mouth as needed. May repeat in 2 hours if needed (Patient not taking: Reported on 12/07/2021)  ? ?No facility-administered encounter medications on file as of 12/07/2021.  ? ? ?Allergies (verified) ?Crestor [rosuvastatin calcium], Fosamax [alendronate sodium], Oxycodone-acetaminophen, Prednisone, and Sumatriptan  ? ?History: ?Past Medical History:  ?Diagnosis Date  ? Allergy   ? seasonal  ? Family history of cancer   ? Headache   ? Hearing loss   ? in L ear  ? History  of migraines   ? MRI -head,secondary H?A L_OCCIP within normal limits 07/1999//CT of head without Normal [facility]) 12/21/2007  ? Hyperlipidemia   ? NSVD (normal spontaneous vaginal delivery)   ? x 2  ? Osteoporosis   ? ?Past Surgical History:  ?Procedure Laterality Date  ? ABDOMINAL HYSTERECTOMY  11/99  ? partial hysterectomy ovaries intact (fibroids  ? COLONOSCOPY WITH PROPOFOL N/A 01/13/2018  ? Procedure:  COLONOSCOPY WITH PROPOFOL;  Surgeon: [name], MD;  Location: [facility] ENDOSCOPY;  Service: Endoscopy;  Laterality: N/A;  ? ESOPHAGOGASTRODUODENOSCOPY (EGD) WITH PROPOFOL N/A 01/13/2018  ? Procedure: ESOPHAGOGASTRODUODENOSCOPY (EGD) WITH PROPOFOL;  Surgeon: [name], MD;  Location[facility] ENDOSCOPY;  Service: Endoscopy;  Laterality: N/A;  ? normal vaginal delivery x2 [facility]    ? stress cardiolite  04/28/2001  ? stress cardiolite wnl, EF 69%  ? TUBAL LIGATION  1969  ? ?Family History  ?Problem Relation Age of Onset  ? Heart disease Mother 64  ?     CHF  ? Cancer Mother 50  ?     lung cancer since her 66's with kidney cancer //cancer of the throat and tongue //never smoked or drank  ? Diabetes Mother   ?     borderline  ? Cancer Father 63  ?     melanoma  ? Cancer Sister   ?     breast cancer and ovarian  ? Breast cancer Sister   ? Ovarian cancer Sister   ? Bone cancer Sister   ? Heart disease Paternal Grandfather   ?     MI  ? Diabetes Brother   ? Lung cancer Brother   ? Stroke Maternal Grandmother   ? Depression Maternal Grandfather   ? Colon cancer Neg Hx   ? ?Social History  ? ?Socioeconomic History  ? Marital status: Married  ?  Spouse name: Not on file  ? Number of children: 2  ? Years of education: Not on file  ? Highest education level: Not on file  ?Occupational History  ? Occupation: Retired  ?  Employer: RETIRED  ?  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 Smoking status: Former  ?  Packs/day: 2.00  ?  Years: 22.00  ?  Pack years: 44.00  ?  Types: Cigarettes  ?  Quit date: 09/23/1981  ?  Years since quitting: 40.2  ? Smokeless tobacco: Never  ? Tobacco comments:  ?  quit in 1975  ?Vaping Use  ? Vaping Use: Never used  ?Substance and Sexual Activity  ? Alcohol use: No  ? Drug use: No  ? Sexual activity: Never  ?Other Topics Concern  ? Not on file  ?Social History Narrative  ? Married- 1967  ? Two stepchildren and two children of her own.  ? Retired.   ? ?Social Determinants of Health  ? ?Financial Resource Strain: Low Risk   ? Difficulty of Paying Living Expenses: Not very hard  ?Food Insecurity: No Food Insecurity  ? Worried About Programme researcher, broadcasting/film/video in the Last Year: Never true  ? Ran Out of Food in the Last Year: Never true  ?Transportation Needs: No Transportation Needs  ? Lack of Transportation (Medical): No  ? Lack of Transportation (Non-Medical): No  ?Physical Activity: Inactive  ? Days of Exercise per Week: 0 days  ? Minutes of Exercise per Session: 0 min  ?Stress: No Stress Concern Present  ? Feeling of Stress : Not at all  ?  Social Connections: Moderately Integrated  ? Frequency of Communication with Friends and Family: More than three times a week  ? Frequency of Social Gatherings with Friends and Family: More than three times a week  ? Attends Religious Services: 1 to 4 times per year  ? Active Member of Clubs or Organizations: No  ? Attends Banker Meetings: Never  ? Marital Status: Married  ? ? ?Tobacco Counseling ?Counseling given: Not Answered ?Tobacco comments: quit in 1975 ? ? ?Clinical Intake: ? ?Pre-visit preparation completed: Yes ? ?Pain : No/denies pain ? ?  ? ?BMI - recorded: 29.07 ?Nutritional Status: BMI 25 -29 Overweight ?Nutritional Risks: None ?Diabetes: No ? ?How often do you need to have someone help you when you read instructions, pamphlets, or other written materials from your doctor or pharmacy?: 1 - Never ? ?Diabetic? No ? ?Interpreter Needed?: No ? ?Information entered by :: Adine Heimann LPN ? ? ?Activities of Daily Living ?In your present state of health, do you have any difficulty performing the following activities: 12/07/2021  ?Hearing? Y  ?Comment decrease hearing in left ear  ?Vision? N  ?Difficulty concentrating or making decisions? N  ?Walking or climbing stairs? N  ?Dressing or bathing? N  ?Doing errands, shopping? N  ?Preparing Food and eating ? N  ?Using the Toilet? N  ?In the past six months, have you  accidently leaked urine? N  ?Do you have problems with loss of bowel control? N  ?Managing your Medications? N  ?Managing your Finances? N  ?Housekeeping or managing your Housekeeping? N  ?Some recent data might be hidden

## 2021-12-07 ENCOUNTER — Ambulatory Visit (INDEPENDENT_AMBULATORY_CARE_PROVIDER_SITE_OTHER): Payer: Medicare Other

## 2021-12-07 VITALS — Ht 66.0 in | Wt 184.0 lb

## 2021-12-07 DIAGNOSIS — Z0000 Encounter for general adult medical examination without abnormal findings: Secondary | ICD-10-CM | POA: Diagnosis not present

## 2021-12-07 NOTE — Patient Instructions (Signed)
Ms. Bencomo , ?Thank you for taking time to complete your Medicare Wellness Visit. I appreciate your ongoing commitment to your health goals. Please review the following plan we discussed and let me know if I can assist you in the future.  ? ?Screening recommendations/referrals: ?Colonoscopy: up to date, cologuard due 04/10/24 ?Mammogram: up to date, next due 05/23/22 ?Bone Density: due, per our conversation you plan to discuss with PCP ?Recommended yearly ophthalmology/optometry visit for glaucoma screening and checkup: you have an appointment scheduled in August with Dr. Dorcas Mcmurray ?Recommended yearly dental visit for hygiene and checkup ? ?Vaccinations: ?Influenza vaccine: Due-May obtain vaccine at our office or your local pharmacy. ?Pneumococcal vaccine: up to date ?Tdap vaccine: up to date, due 08/04/28 ?Shingles vaccine: Discuss with pharmacy   ?Covid-19:newest booster available at your local pharmacy  ? ?Advanced directives: Please bring a copy of Living Will and/or Healthcare Power of Attorney for your chart. ? ? ?Conditions/risks identified: see problem list ? ?Next appointment: Follow up in one year for your annual wellness visit  ? ? ?Preventive Care 76 Years and Older, Female ?Preventive care refers to lifestyle choices and visits with your health care provider that can promote health and wellness. ?What does preventive care include? ?A yearly physical exam. This is also called an annual well check. ?Dental exams once or twice a year. ?Routine eye exams. Ask your health care provider how often you should have your eyes checked. ?Personal lifestyle choices, including: ?Daily care of your teeth and gums. ?Regular physical activity. ?Eating a healthy diet. ?Avoiding tobacco and drug use. ?Limiting alcohol use. ?Practicing safe sex. ?Taking low-dose aspirin every day. ?Taking vitamin and mineral supplements as recommended by your health care provider. ?What happens during an annual well check? ?The services  and screenings done by your health care provider during your annual well check will depend on your age, overall health, lifestyle risk factors, and family history of disease. ?Counseling  ?Your health care provider may ask you questions about your: ?Alcohol use. ?Tobacco use. ?Drug use. ?Emotional well-being. ?Home and relationship well-being. ?Sexual activity. ?Eating habits. ?History of falls. ?Memory and ability to understand (cognition). ?Work and work Astronomer. ?Reproductive health. ?Screening  ?You may have the following tests or measurements: ?Height, weight, and BMI. ?Blood pressure. ?Lipid and cholesterol levels. These may be checked every 5 years, or more frequently if you are over 64 years old. ?Skin check. ?Lung cancer screening. You may have this screening every year starting at age 76 if you have a 30-pack-year history of smoking and currently smoke or have quit within the past 15 years. ?Fecal occult blood test (FOBT) of the stool. You may have this test every year starting at age 76. ?Flexible sigmoidoscopy or colonoscopy. You may have a sigmoidoscopy every 5 years or a colonoscopy every 10 years starting at age 55. ?Hepatitis C blood test. ?Hepatitis B blood test. ?Sexually transmitted disease (STD) testing. ?Diabetes screening. This is done by checking your blood sugar (glucose) after you have not eaten for a while (fasting). You may have this done every 1-3 years. ?Bone density scan. This is done to screen for osteoporosis. You may have this done starting at age 58. ?Mammogram. This may be done every 1-2 years. Talk to your health care provider about how often you should have regular mammograms. ?Talk with your health care provider about your test results, treatment options, and if necessary, the need for more tests. ?Vaccines  ?Your health care provider may recommend certain vaccines, such  as: ?Influenza vaccine. This is recommended every year. ?Tetanus, diphtheria, and acellular pertussis  (Tdap, Td) vaccine. You may need a Td booster every 10 years. ?Zoster vaccine. You may need this after age 17. ?Pneumococcal 13-valent conjugate (PCV13) vaccine. One dose is recommended after age 88. ?Pneumococcal polysaccharide (PPSV23) vaccine. One dose is recommended after age 48. ?Talk to your health care provider about which screenings and vaccines you need and how often you need them. ?This information is not intended to replace advice given to you by your health care provider. Make sure you discuss any questions you have with your health care provider. ?Document Released: 10/06/2015 Document Revised: 05/29/2016 Document Reviewed: 07/11/2015 ?Elsevier Interactive Patient Education ? 2017 [employer] ? ?Fall Prevention in the Home ?Falls can cause injuries. They can happen to people of all ages. There are many things you can do to make your home safe and to help prevent falls. ?What can I do on the outside of my home? ?Regularly fix the edges of walkways and driveways and fix any cracks. ?Remove anything that might make you trip as you walk through a door, such as a raised step or threshold. ?Trim any bushes or trees on the path to your home. ?Use bright outdoor lighting. ?Clear any walking paths of anything that might make someone trip, such as rocks or tools. ?Regularly check to see if handrails are loose or broken. Make sure that both sides of any steps have handrails. ?Any raised decks and porches should have guardrails on the edges. ?Have any leaves, snow, or ice cleared regularly. ?Use sand or salt on walking paths during winter. ?Clean up any spills in your garage right away. This includes oil or grease spills. ?What can I do in the bathroom? ?Use night lights. ?Install grab bars by the toilet and in the tub and shower. Do not use towel bars as grab bars. ?Use non-skid mats or decals in the tub or shower. ?If you need to sit down in the shower, use a plastic, non-slip stool. ?Keep the floor dry. Clean  up any water that spills on the floor as soon as it happens. ?Remove soap buildup in the tub or shower regularly. ?Attach bath mats securely with double-sided non-slip rug tape. ?Do not have throw rugs and other things on the floor that can make you trip. ?What can I do in the bedroom? ?Use night lights. ?Make sure that you have a light by your bed that is easy to reach. ?Do not use any sheets or blankets that are too big for your bed. They should not hang down onto the floor. ?Have a firm chair that has side arms. You can use this for support while you get dressed. ?Do not have throw rugs and other things on the floor that can make you trip. ?What can I do in the kitchen? ?Clean up any spills right away. ?Avoid walking on wet floors. ?Keep items that you use a lot in easy-to-reach places. ?If you need to reach something above you, use a strong step stool that has a grab bar. ?Keep electrical cords out of the way. ?Do not use floor polish or wax that makes floors slippery. If you must use wax, use non-skid floor wax. ?Do not have throw rugs and other things on the floor that can make you trip. ?What can I do with my stairs? ?Do not leave any items on the stairs. ?Make sure that there are handrails on both sides of the stairs and use  them. Fix handrails that are broken or loose. Make sure that handrails are as long as the stairways. ?Check any carpeting to make sure that it is firmly attached to the stairs. Fix any carpet that is loose or worn. ?Avoid having throw rugs at the top or bottom of the stairs. If you do have throw rugs, attach them to the floor with carpet tape. ?Make sure that you have a light switch at the top of the stairs and the bottom of the stairs. If you do not have them, ask someone to add them for you. ?What else can I do to help prevent falls? ?Wear shoes that: ?Do not have high heels. ?Have rubber bottoms. ?Are comfortable and fit you well. ?Are closed at the toe. Do not wear sandals. ?If you  use a stepladder: ?Make sure that it is fully opened. Do not climb a closed stepladder. ?Make sure that both sides of the stepladder are locked into place. ?Ask someone to hold it for you, if possible. ?C

## 2021-12-09 ENCOUNTER — Other Ambulatory Visit: Payer: Self-pay | Admitting: Family Medicine

## 2021-12-09 DIAGNOSIS — M810 Age-related osteoporosis without current pathological fracture: Secondary | ICD-10-CM

## 2021-12-09 DIAGNOSIS — E7800 Pure hypercholesterolemia, unspecified: Secondary | ICD-10-CM

## 2021-12-10 ENCOUNTER — Ambulatory Visit: Payer: Medicare Other

## 2021-12-10 ENCOUNTER — Other Ambulatory Visit: Payer: Medicare Other

## 2021-12-12 ENCOUNTER — Telehealth: Payer: Self-pay | Admitting: Family Medicine

## 2021-12-12 ENCOUNTER — Other Ambulatory Visit (INDEPENDENT_AMBULATORY_CARE_PROVIDER_SITE_OTHER): Payer: Medicare Other

## 2021-12-12 ENCOUNTER — Telehealth: Payer: Self-pay | Admitting: Radiology

## 2021-12-12 ENCOUNTER — Other Ambulatory Visit: Payer: Self-pay

## 2021-12-12 DIAGNOSIS — E7800 Pure hypercholesterolemia, unspecified: Secondary | ICD-10-CM | POA: Diagnosis not present

## 2021-12-12 DIAGNOSIS — M810 Age-related osteoporosis without current pathological fracture: Secondary | ICD-10-CM

## 2021-12-12 LAB — LIPID PANEL
Cholesterol: 180 mg/dL (ref 0–200)
HDL: 54.4 mg/dL (ref >39.00)
LDL Cholesterol: 103 mg/dL — ABNORMAL HIGH (ref 0–99)
NonHDL: 125.68
Total CHOL/HDL Ratio: 3
Triglycerides: 111 mg/dL (ref 0.0–149.0)
VLDL: 22.2 mg/dL (ref 0.0–40.0)

## 2021-12-12 LAB — VITAMIN D 25 HYDROXY (VIT D DEFICIENCY, FRACTURES): VITD: 29.31 ng/mL — ABNORMAL LOW (ref 30.00–100.00)

## 2021-12-12 MED ORDER — RIZATRIPTAN BENZOATE 10 MG PO TBDP: 10.0000 mg | ORAL_TABLET | ORAL | 3 refills | Status: DC | PRN

## 2021-12-12 MED ORDER — RIZATRIPTAN BENZOATE 10 MG PO TBDP: 10.0000 mg | ORAL_TABLET | ORAL | Status: DC | PRN

## 2021-12-12 NOTE — Telephone Encounter (Signed)
Pt called stating that she told you the wrong medication that she was allergic too. Pt states the medication is rizatriptan (MAXALT-MLT) 10 MG disintegrating tablet. ?

## 2021-12-12 NOTE — Telephone Encounter (Signed)
Called spoke with informed of this information. ?

## 2021-12-12 NOTE — Telephone Encounter (Signed)
Patient wants to know if she can take her migraine medication now that she is on Eliquis. ?

## 2021-12-12 NOTE — Telephone Encounter (Signed)
Please call patient and verify her current medications and her allergies/intolerances.  If we need to address her migraines then please schedule an office visit.  Thanks. ?

## 2021-12-12 NOTE — Telephone Encounter (Signed)
Called spoke with pt she said that she  Not allergy to [place]  she is able to take this medication, she said that she will needs refill soon because she almost out. Please advise .  ?

## 2021-12-12 NOTE — Telephone Encounter (Signed)
Please make sure she is asking about Maxalt.  My understanding is that she has used Maxalt in the past with relief and tolerated it well.  If that is the case then she should still be able to use it even with concurrent Eliquis use.  Thanks. ?

## 2021-12-12 NOTE — Telephone Encounter (Signed)
Sent. Thanks.   

## 2021-12-13 NOTE — Telephone Encounter (Signed)
Patient notified rx was sent 

## 2021-12-17 ENCOUNTER — Other Ambulatory Visit: Payer: Self-pay

## 2021-12-17 ENCOUNTER — Encounter: Payer: Self-pay | Admitting: Family Medicine

## 2021-12-17 ENCOUNTER — Telehealth: Payer: Self-pay | Admitting: Family Medicine

## 2021-12-17 ENCOUNTER — Ambulatory Visit (INDEPENDENT_AMBULATORY_CARE_PROVIDER_SITE_OTHER): Payer: Medicare Other | Admitting: Family Medicine

## 2021-12-17 VITALS — BP 124/80 | HR 77 | Temp 97.2°F | Ht 66.0 in | Wt 186.0 lb

## 2021-12-17 DIAGNOSIS — G43909 Migraine, unspecified, not intractable, without status migrainosus: Secondary | ICD-10-CM | POA: Diagnosis not present

## 2021-12-17 DIAGNOSIS — E7800 Pure hypercholesterolemia, unspecified: Secondary | ICD-10-CM

## 2021-12-17 DIAGNOSIS — Z0000 Encounter for general adult medical examination without abnormal findings: Secondary | ICD-10-CM

## 2021-12-17 DIAGNOSIS — I4891 Unspecified atrial fibrillation: Secondary | ICD-10-CM

## 2021-12-17 DIAGNOSIS — M810 Age-related osteoporosis without current pathological fracture: Secondary | ICD-10-CM

## 2021-12-17 DIAGNOSIS — Z7189 Other specified counseling: Secondary | ICD-10-CM

## 2021-12-17 MED ORDER — APIXABAN 5 MG PO TABS: 5.0000 mg | ORAL_TABLET | Freq: Two times a day (BID) | ORAL | 12 refills | Status: DC

## 2021-12-17 NOTE — Telephone Encounter (Signed)
Pt called stating that medication Ondansetron is not working and its making her feel drunk. ?

## 2021-12-17 NOTE — Patient Instructions (Addendum)
Check to see zofran/ondansetron was the medicine that you couldn't tolerate.   ? ?Take care.  Glad to see you. ? ?We'll call about getting your bone density test set up.  ?

## 2021-12-17 NOTE — Progress Notes (Signed)
This visit occurred during the SARS-CoV-2 public health emergency.  Safety protocols were in place, including screening questions prior to the visit, additional usage of staff PPE, and extensive cleaning of exam room while observing appropriate contact time as indicated for disinfecting solutions. ? ?AF. No ADE on anticoagulation.  No bleeding now- she had some bleeding with gum irritation prev- she is careful brushing.  No CP SOB BLE edema.  No heart racing.  ? ?Elevated Cholesterol: ?Using medications without problems: yes ?Muscle aches: no ?Diet compliance: yes ?Exercise: yes ? ?Maxalt helps with migraines w/o ADE on med.  Use as needed.  Discussed with patient. ? ?Osteoporosis.  D/w pt about recheck DXA and possibly consider prolia.  She is back on Vit D now.  Recent level low but she had been off replacement with prior covid illness.  ? ?Flu vaccine - done 2021 ?PNA up to date.   ?Shingles - d/w pt.   ?Tetanus 2019 ?covid vaccine up to date.   ?Hep C screening - negative prev, d/w pt.  ?cologuard neg 2022 ?Mammogram 2022 ?DXA ordered 2023.   ?Advance directive- would have husband designated if she were incapacitated.  If husband were incapacitated, then would have daughters Kathe Becton and French Ana equally designated.   ? ?PMH and SH reviewed ? ?Meds, vitals, and allergies reviewed.  ? ?ROS: Per HPI unless specifically indicated in ROS section  ? ?GEN: nad, alert and oriented ?HEENT: ncat ?NECK: supple w/o LA ?CV: RRR ?PULM: ctab, no inc wob ?ABD: soft, +bs ?EXT: no edema ?SKIN: Well-perfused. ? ?30 minutes were devoted to patient care in this encounter (this includes time spent reviewing the patient's file/history, interviewing and examining the patient, counseling/reviewing plan with patient).  ? ?

## 2021-12-18 NOTE — Telephone Encounter (Signed)
Noted.  Chart updated re: intolerance.  Thanks.  ?

## 2021-12-19 NOTE — Assessment & Plan Note (Signed)
Continue pravastatin 

## 2021-12-19 NOTE — Assessment & Plan Note (Signed)
D/w pt about recheck DXA and possibly consider prolia.  She is back on Vit D now.  Recent level low but she had been off replacement with prior covid illness.  Bone density ordered.  We will get those results and go from there. ?

## 2021-12-19 NOTE — Assessment & Plan Note (Signed)
-   Continue Eliquis 

## 2021-12-19 NOTE — Assessment & Plan Note (Signed)
Advance directive- would have husband designated if she were incapacitated. If husband were incapacitated, then would have daughters Robbin and Tracy equally designated.   

## 2021-12-19 NOTE — Assessment & Plan Note (Signed)
Flu vaccine - done 2021 ?PNA up to date.   ?Shingles - d/w pt.   ?Tetanus 2019 ?covid vaccine up to date.   ?Hep C screening - negative prev, d/w pt.  ?cologuard neg 2022 ?Mammogram 2022 ?DXA ordered 2023.   ?Advance directive- would have husband designated if she were incapacitated.  If husband were incapacitated, then would have daughters Kathe Becton and French Ana equally designated.   ?

## 2021-12-19 NOTE — Assessment & Plan Note (Signed)
Continue as needed Maxalt. 

## 2022-01-05 DIAGNOSIS — Z7901 Long term (current) use of anticoagulants: Secondary | ICD-10-CM | POA: Diagnosis not present

## 2022-01-05 DIAGNOSIS — K068 Other specified disorders of gingiva and edentulous alveolar ridge: Secondary | ICD-10-CM | POA: Diagnosis not present

## 2022-01-11 ENCOUNTER — Other Ambulatory Visit: Payer: Self-pay | Admitting: Family Medicine

## 2022-01-15 ENCOUNTER — Other Ambulatory Visit: Payer: Self-pay | Admitting: Family Medicine

## 2022-01-15 DIAGNOSIS — I4891 Unspecified atrial fibrillation: Secondary | ICD-10-CM

## 2022-02-06 DIAGNOSIS — R0602 Shortness of breath: Secondary | ICD-10-CM | POA: Diagnosis not present

## 2022-02-06 DIAGNOSIS — I480 Paroxysmal atrial fibrillation: Secondary | ICD-10-CM | POA: Diagnosis not present

## 2022-02-06 DIAGNOSIS — R079 Chest pain, unspecified: Secondary | ICD-10-CM | POA: Diagnosis not present

## 2022-02-14 ENCOUNTER — Telehealth: Payer: Self-pay | Admitting: Family Medicine

## 2022-02-14 NOTE — Telephone Encounter (Signed)
Please help this patient get DXA scheduled.  Thanks.

## 2022-02-14 NOTE — Telephone Encounter (Signed)
Please help this patient get scheduled for her DXA.  The order is in the EMR.  Thanks.

## 2022-02-14 NOTE — Telephone Encounter (Signed)
I do not schedule Bone Densities. The CMA's have the information of where the patient calls to schedule these.

## 2022-02-15 NOTE — Telephone Encounter (Signed)
Spoke to pt. She was very upset that we were giving her the information to make her own appt for a DXA. Said she has never had to do this before. Why do we have a referral coordinator. I told her I would call [facility] for her and call her back. Unfortunately, no one answered and I had to leave a message. I left her info for them to call. I called the pt back and advised her that [facility] should call her about an appt. She apologized for getting up set with me about a situation out of my control. She is really upset that [facility] or [facility] has chosen to do this. Said her insurance requires a referral from her Dr for the DXA. She did say she appreciated me calling them.

## 2022-02-18 NOTE — Telephone Encounter (Signed)
Noted, thanks!

## 2022-02-25 ENCOUNTER — Other Ambulatory Visit: Payer: Self-pay | Admitting: Family Medicine

## 2022-02-25 DIAGNOSIS — Z1231 Encounter for screening mammogram for malignant neoplasm of breast: Secondary | ICD-10-CM

## 2022-04-04 ENCOUNTER — Telehealth: Payer: Self-pay | Admitting: Family Medicine

## 2022-04-04 NOTE — Telephone Encounter (Signed)
I spoke with pt; pt was stung by yellow jackets 6 times on legs, arms and little finger around lunch time today.  Pt said they are not hurting as bad and pt has put a baking soda paste and neosporin on the areas stung. Pt is not having any difficulty in breathing and no swelling in neck throat,mouth or face.pt said she will try what the access nurse advised and will apply ice also. UC & ED precautions given and pt voiced understanding. Sending note to Dr Para March and Shanda Bumps CMA.

## 2022-04-04 NOTE — Telephone Encounter (Signed)
[facility] Day - Client TELEPHONE ADVICE RECORD Access[facility]urse Patient Name: Rebecca Moran Gender: Female DOB: 06-06-1946 Age: 76 Y 24 D Return Phone Number: [phone] (Primary) Address: City/ State/ Zip: [place] [address] Client [facility] Day - Client Client Site [facility] - Day Provider Raechel Ache - MD Contact Type Call Who Is Calling Patient / Member / Family / Caregiver Call Type Triage / Clinical Relationship To Patient Self Return Phone Number [phone] (Primary) Chief Complaint Pain - Generalized Reason for Call Symptomatic / Request for Health Information Initial Comment Caller states she was stung 6 times by a yellow jacket, she is in a lot of pain. Translation No Nurse Assessment Nurse: Annye English, RN, Denise Date/Time (Eastern Time): 04/04/2022 3:36:20 PM Confirm and document reason for call. If symptomatic, describe symptoms. ---Caller states she was stung 6 times by a yellow jacket, she is in a lot of pain. Stung on both arms, hands, feet and legs. Does the patient have any new or worsening symptoms? ---Yes Will a triage be completed? ---Yes Related visit to physician within the last 2 weeks? ---No Does the PT have any chronic conditions? (i.e. diabetes, asthma, this includes High risk factors for pregnancy, etc.) ---Yes List chronic conditions. ---A-Fib w/o use of anticoagulants. Is this a behavioral health or substance abuse call? ---No Guidelines Guideline Title Affirmed Question Affirmed Notes Nurse Date/Time (Eastern Time) Bee or Yellow Jacket Sting Normal local reaction to bee, wasp, or yellow jacket sting Annye English, RN, Angelique Blonder 04/04/2022 3:38:12 PM Disp. Time Lamount Cohen Time) Disposition Final User 04/04/2022 3:43:29 PM Home Care Yes Annye English, RN, Denise Final Disposition 04/04/2022 3:43:29 PM Home Care Yes Carmon, RN, Angelique Blonder PLEASE NOTE: All timestamps contained within  this report are represented as [place] Standard Time. CONFIDENTIALTY NOTICE: This fax transmission is intended only for the addressee. It contains information that is legally privileged, confidential or otherwise protected from use or disclosure. If you are not the intended recipient, you are strictly prohibited from reviewing, disclosing, copying using or disseminating any of this information or taking any action in reliance on or regarding this information. If you have received this fax in error, please notify us immediately by telephone so that we can arrange for its return to [place]. Phone: [phone], Toll-Free: [phone], Fax: [phone] Page: 2 of 2 Call Id: [id number] Caller Disagree/Comply Comply Caller Understands Yes PreDisposition Call Doctor Care Advice Given Per Guideline HOME CARE: * You should be able to treat this at home. REASSURANCE AND EDUCATION - LOCAL REACTION TO STING: * It sounds like a routine bee (wasp, yellow jacket) sting that we can treat at home. APPLY LOCAL COLD - COLD PACK METHOD: * Wrap a bag of ice in a towel. Or use a bag of frozen vegetables, like peas. * ACETAMINOPHEN - EXTRA STRENGTH TYLENOL: Take 1,000 mg (two 500 mg pills). PAIN MEDICINES FOR STING: * Apply this cold pack to the area of the sting for 10 to 20 minutes. ANTIHISTAMINE MEDICINES FOR MODERATE TO SEVERE ITCHING: * LORATADINE (ALAVERT, CLARITIN): The adult dose is 10 mg. You take it once a day. Loratadine is available in the [place] as Programmer, systems; it is available in [place] as Claritin. * FEXOFENADINE (ALLEGRA): In the [place], the adult dose is one 24- hour tablet (180 mg) once a day. In [place], the adult dose is one 24-hour tablet (120 mg) once a day. Or, you can take one 12-hour (60 mg) tablet 2  times a day. HYDROCORTISONE CREAM FOR ITCHING FROM STING: * Put 1% hydrocortisone cream on the sting 3 times a day. Use it for a couple days, until it feels better. This will help  decrease the itching. REMOVE RINGS: * For swelling that involves the hand, remove any rings on the fingers. CALL BACK IF: * You become worse CARE ADVICE given per Bee or Yellow Jacket Sting (Adult) guideline

## 2022-04-04 NOTE — Telephone Encounter (Signed)
Patient called in stating that she was stung by 6 yellow jackets. She states that she's in a lot of pain. Transferred her to acces nurse.

## 2022-04-05 NOTE — Telephone Encounter (Signed)
Noted. Thanks.

## 2022-04-23 ENCOUNTER — Other Ambulatory Visit: Payer: Medicare Other

## 2022-05-20 ENCOUNTER — Other Ambulatory Visit: Payer: Medicare Other

## 2022-05-24 ENCOUNTER — Ambulatory Visit
Admission: RE | Admit: 2022-05-24 | Discharge: 2022-05-24 | Disposition: A | Discharge status: Home / Self Care | Payer: Medicare Other | Source: Ambulatory Visit | Attending: Family Medicine | Admitting: Family Medicine

## 2022-05-24 DIAGNOSIS — Z1231 Encounter for screening mammogram for malignant neoplasm of breast: Secondary | ICD-10-CM | POA: Diagnosis not present

## 2022-06-20 ENCOUNTER — Ambulatory Visit (INDEPENDENT_AMBULATORY_CARE_PROVIDER_SITE_OTHER): Payer: Medicare Other

## 2022-06-20 DIAGNOSIS — Z23 Encounter for immunization: Secondary | ICD-10-CM | POA: Diagnosis not present

## 2022-06-26 DIAGNOSIS — H2513 Age-related nuclear cataract, bilateral: Secondary | ICD-10-CM | POA: Diagnosis not present

## 2022-07-02 ENCOUNTER — Ambulatory Visit
Admission: RE | Admit: 2022-07-02 | Discharge: 2022-07-02 | Disposition: A | Discharge status: Home / Self Care | Payer: Medicare Other | Source: Ambulatory Visit | Attending: Family Medicine | Admitting: Family Medicine

## 2022-07-02 DIAGNOSIS — M810 Age-related osteoporosis without current pathological fracture: Secondary | ICD-10-CM | POA: Insufficient documentation

## 2022-07-03 ENCOUNTER — Other Ambulatory Visit: Payer: Self-pay | Admitting: Family Medicine

## 2022-07-03 DIAGNOSIS — M810 Age-related osteoporosis without current pathological fracture: Secondary | ICD-10-CM

## 2022-07-11 ENCOUNTER — Other Ambulatory Visit (INDEPENDENT_AMBULATORY_CARE_PROVIDER_SITE_OTHER): Payer: Medicare Other

## 2022-07-11 ENCOUNTER — Ambulatory Visit (INDEPENDENT_AMBULATORY_CARE_PROVIDER_SITE_OTHER): Payer: Medicare Other | Admitting: Family Medicine

## 2022-07-11 ENCOUNTER — Encounter: Payer: Self-pay | Admitting: Family Medicine

## 2022-07-11 DIAGNOSIS — M810 Age-related osteoporosis without current pathological fracture: Secondary | ICD-10-CM | POA: Diagnosis not present

## 2022-07-11 LAB — VITAMIN D 25 HYDROXY (VIT D DEFICIENCY, FRACTURES): VITD: 35.74 ng/mL (ref 30.00–100.00)

## 2022-07-11 LAB — BASIC METABOLIC PANEL
BUN: 17 mg/dL (ref 6–23)
CO2: 29 mEq/L (ref 19–32)
Calcium: 9.6 mg/dL (ref 8.4–10.5)
Chloride: 106 mEq/L (ref 96–112)
Creatinine, Ser: 1.1 mg/dL (ref 0.40–1.20)
GFR: 48.87 mL/min — ABNORMAL LOW (ref >60.00)
Glucose, Bld: 92 mg/dL (ref 70–99)
Potassium: 3.8 mEq/L (ref 3.5–5.1)
Sodium: 142 mEq/L (ref 135–145)

## 2022-07-11 MED ORDER — VITAMIN D3 50 MCG (2000 UT) PO CAPS: 2000.0000 [IU] | ORAL_CAPSULE | Freq: Every day | ORAL | Status: AC

## 2022-07-11 NOTE — Progress Notes (Signed)
She had lower BP noted in the early AMs, occ.  Not every day. No on BP meds.  Still on eliquis and pravastatin.  No clear trigger for the lower BPs. She is drinking 2 cokes a day, similar amount of water per day.  D/w pt about inc water and monitoring BP. She isn't lightheaded.    D/w pt about DXA results and osteoporosis path/phys in general, including vit D and calcium.  Intolerant of bisphosphonate, ie fosamax.  D/w pt about risk benefit, especially GI sx, jaw and long bone pathology.    She had trouble taking fosamax prev.   She may need dental work done and may need to defer treatment at this point.  May end up needing prolia but only after getting input from dental clinic.   Recently restarted vit D 2000 units daily.    Meds, vitals, and allergies reviewed.   ROS: Per HPI unless specifically indicated in ROS section   Nad Ncat Neck supple, no LA Rrr

## 2022-07-11 NOTE — Patient Instructions (Addendum)
Increase water intake and let me know if your BP is still low.   Go to the lab on the way out.   If you have mychart we'll likely use that to update you.    Take care.  Glad to see you. Check with the dental clinic in the meantime.

## 2022-07-14 NOTE — Assessment & Plan Note (Signed)
She had trouble taking fosamax prev.   She may need dental work done and may need to defer treatment at this point.  May end up needing prolia but only after getting input from dental clinic.   Recently restarted vit D 2000 units daily.    See after visit summary notes on labs.

## 2022-07-22 DIAGNOSIS — R079 Chest pain, unspecified: Secondary | ICD-10-CM | POA: Diagnosis not present

## 2022-07-22 DIAGNOSIS — R0602 Shortness of breath: Secondary | ICD-10-CM | POA: Diagnosis not present

## 2022-07-22 DIAGNOSIS — I480 Paroxysmal atrial fibrillation: Secondary | ICD-10-CM | POA: Diagnosis not present

## 2022-10-08 ENCOUNTER — Other Ambulatory Visit: Payer: Self-pay | Admitting: Family Medicine

## 2022-11-20 ENCOUNTER — Telehealth: Payer: Self-pay | Admitting: Family Medicine

## 2022-11-20 NOTE — Telephone Encounter (Signed)
[place] to schedule their annual wellness visit. Appointment made for 12/26/2022.  [facility] Direct Dial: [phone]

## 2022-12-13 ENCOUNTER — Other Ambulatory Visit: Payer: Self-pay | Admitting: Family Medicine

## 2022-12-13 DIAGNOSIS — I4891 Unspecified atrial fibrillation: Secondary | ICD-10-CM

## 2022-12-13 DIAGNOSIS — M810 Age-related osteoporosis without current pathological fracture: Secondary | ICD-10-CM

## 2022-12-13 DIAGNOSIS — E7800 Pure hypercholesterolemia, unspecified: Secondary | ICD-10-CM

## 2022-12-26 ENCOUNTER — Other Ambulatory Visit: Payer: Medicare Other

## 2022-12-26 ENCOUNTER — Encounter: Payer: Self-pay | Admitting: Family Medicine

## 2022-12-26 ENCOUNTER — Ambulatory Visit (INDEPENDENT_AMBULATORY_CARE_PROVIDER_SITE_OTHER): Payer: Medicare Other | Admitting: Family Medicine

## 2022-12-26 ENCOUNTER — Ambulatory Visit (INDEPENDENT_AMBULATORY_CARE_PROVIDER_SITE_OTHER)
Admission: RE | Admit: 2022-12-26 | Discharge: 2022-12-26 | Disposition: A | Discharge status: Home / Self Care | Payer: Medicare Other | Source: Ambulatory Visit | Attending: Family Medicine | Admitting: Family Medicine

## 2022-12-26 ENCOUNTER — Ambulatory Visit (INDEPENDENT_AMBULATORY_CARE_PROVIDER_SITE_OTHER): Payer: Medicare Other

## 2022-12-26 VITALS — Ht 65.5 in | Wt 189.0 lb

## 2022-12-26 VITALS — BP 118/88 | HR 69 | Temp 97.3°F | Wt 191.0 lb

## 2022-12-26 DIAGNOSIS — I4891 Unspecified atrial fibrillation: Secondary | ICD-10-CM

## 2022-12-26 DIAGNOSIS — Z0000 Encounter for general adult medical examination without abnormal findings: Secondary | ICD-10-CM

## 2022-12-26 DIAGNOSIS — R319 Hematuria, unspecified: Secondary | ICD-10-CM | POA: Diagnosis not present

## 2022-12-26 DIAGNOSIS — M545 Low back pain, unspecified: Secondary | ICD-10-CM

## 2022-12-26 DIAGNOSIS — E7800 Pure hypercholesterolemia, unspecified: Secondary | ICD-10-CM | POA: Diagnosis not present

## 2022-12-26 DIAGNOSIS — M810 Age-related osteoporosis without current pathological fracture: Secondary | ICD-10-CM | POA: Diagnosis not present

## 2022-12-26 DIAGNOSIS — R109 Unspecified abdominal pain: Secondary | ICD-10-CM | POA: Diagnosis not present

## 2022-12-26 DIAGNOSIS — K5900 Constipation, unspecified: Secondary | ICD-10-CM | POA: Diagnosis not present

## 2022-12-26 LAB — LIPID PANEL
Cholesterol: 188 mg/dL (ref 0–200)
HDL: 46.6 mg/dL (ref >39.00)
LDL Cholesterol: 112 mg/dL — ABNORMAL HIGH (ref 0–99)
NonHDL: 141.7
Total CHOL/HDL Ratio: 4
Triglycerides: 149 mg/dL (ref 0.0–149.0)
VLDL: 29.8 mg/dL (ref 0.0–40.0)

## 2022-12-26 LAB — POC URINALSYSI DIPSTICK (AUTOMATED)
Bilirubin, UA: NEGATIVE
Blood, UA: 200
Glucose, UA: NEGATIVE
Ketones, UA: NEGATIVE
Leukocytes, UA: NEGATIVE
Nitrite, UA: NEGATIVE
Protein, UA: NEGATIVE
Spec Grav, UA: 1.01 (ref 1.010–1.025)
Urobilinogen, UA: 1 E.U./dL
pH, UA: 7.5 (ref 5.0–8.0)

## 2022-12-26 LAB — CBC WITH DIFFERENTIAL/PLATELET
Basophils Absolute: 0 10*3/uL (ref 0.0–0.1)
Basophils Relative: 0.8 % (ref 0.0–3.0)
Eosinophils Absolute: 0.2 10*3/uL (ref 0.0–0.7)
Eosinophils Relative: 3.2 % (ref 0.0–5.0)
HCT: 40.8 % (ref 36.0–46.0)
Hemoglobin: 13.8 g/dL (ref 12.0–15.0)
Lymphocytes Relative: 35.1 % (ref 12.0–46.0)
Lymphs Abs: 2 10*3/uL (ref 0.7–4.0)
MCHC: 33.8 g/dL (ref 30.0–36.0)
MCV: 83.7 fl (ref 78.0–100.0)
Monocytes Absolute: 0.5 10*3/uL (ref 0.1–1.0)
Monocytes Relative: 8.1 % (ref 3.0–12.0)
Neutro Abs: 2.9 10*3/uL (ref 1.4–7.7)
Neutrophils Relative %: 52.8 % (ref 43.0–77.0)
Platelets: 190 10*3/uL (ref 150.0–400.0)
RBC: 4.88 Mil/uL (ref 3.87–5.11)
RDW: 13.9 % (ref 11.5–15.5)
WBC: 5.6 10*3/uL (ref 4.0–10.5)

## 2022-12-26 LAB — COMPREHENSIVE METABOLIC PANEL
ALT: 13 U/L (ref 0–35)
AST: 22 U/L (ref 0–37)
Albumin: 4.2 g/dL (ref 3.5–5.2)
Alkaline Phosphatase: 70 U/L (ref 39–117)
BUN: 16 mg/dL (ref 6–23)
CO2: 32 mEq/L (ref 19–32)
Calcium: 9.3 mg/dL (ref 8.4–10.5)
Chloride: 103 mEq/L (ref 96–112)
Creatinine, Ser: 1.12 mg/dL (ref 0.40–1.20)
GFR: 47.67 mL/min — ABNORMAL LOW (ref >60.00)
Glucose, Bld: 86 mg/dL (ref 70–99)
Potassium: 3.9 mEq/L (ref 3.5–5.1)
Sodium: 140 mEq/L (ref 135–145)
Total Bilirubin: 1.3 mg/dL — ABNORMAL HIGH (ref 0.2–1.2)
Total Protein: 6.7 g/dL (ref 6.0–8.3)

## 2022-12-26 LAB — VITAMIN D 25 HYDROXY (VIT D DEFICIENCY, FRACTURES): VITD: 41.89 ng/mL (ref 30.00–100.00)

## 2022-12-26 LAB — TSH: TSH: 3.18 u[IU]/mL (ref 0.35–5.50)

## 2022-12-26 NOTE — Progress Notes (Signed)
128/80 on home check, her cuff, this AM.  She had variable BPs at home, with some fatigue when her BP is lower.  Not on any meds to lower BP.  Recheck blood pressure on her cuff was not accurate/comparable to ours.  Follow-up labs pending.  L flank/back pain.  H/o renal stone in distant past.  Started 1-2 weeks ago.  U/a d/w pt.  No blood seen in urine.  No burning with urination.  Taking tylenol for the pain with some relief.  She cut back on cola and inc'd water.    Meds, vitals, and allergies reviewed.   ROS: Per HPI unless specifically indicated in ROS section   Nad Ncat Neck supple, no LA Rrr ctab L lower back ttp w/o rash or bruise.   Abd not ttp.   Skin well-perfused.

## 2022-12-26 NOTE — Patient Instructions (Addendum)
Go to the lab on the way out.   If you have mychart we'll likely use that to update you.    Take care.  Glad to see you. We'll update cardiology about your labs.

## 2022-12-26 NOTE — Progress Notes (Signed)
I connected with  AMEYALLI ELICKER on 12/26/22 by a audio enabled telemedicine application and verified that I am speaking with the correct person using two identifiers.  Patient Location: Home  Provider Location: Home Office  I discussed the limitations of evaluation and management by telemedicine. The patient expressed understanding and agreed to proceed.  Subjective:   NAYLAH STORTS is a 77 y.o. female who presents for Medicare Annual (Subsequent) preventive examination.  Review of Systems      Cardiac Risk Factors include: advanced age (>64men, >58 women);sedentary lifestyle     Objective:    Today's Vitals   12/26/22 0948  Weight: 189 lb (85.7 kg)  Height: 5' 5.5" (1.664 m)  PainSc: 2    Body mass index is 30.97 kg/m.     12/26/2022    9:59 AM 12/07/2021    9:49 AM 09/16/2021   10:38 AM 10/20/2020   12:10 PM 08/04/2018    5:18 AM 06/02/2018    9:40 AM 01/13/2018    9:41 AM  Advanced Directives  Does Patient Have a Medical Advance Directive? Yes Yes No No No No No  Type of Paramedic of [place];Living will Living will       Does patient want to make changes to medical advance directive?  Yes (MAU/Ambulatory/Procedural Areas - Information given)       Copy of [address] in Chart? No - copy requested        Would patient like information on creating a medical advance directive?    No - Patient declined No - Patient declined No - Patient declined No - Patient declined    Current Medications (verified) Outpatient Encounter Medications as of 12/26/2022  Medication Sig   cetirizine (ZYRTEC) 10 MG tablet Take 10 mg by mouth daily.   Cholecalciferol (VITAMIN D3) 50 MCG (2000 UT) capsule Take 1 capsule (2,000 Units total) by mouth daily.   ELIQUIS 5 MG TABS tablet TAKE 1 TABLET(5 MG) BY MOUTH TWICE DAILY   fluticasone (FLONASE) 50 MCG/ACT nasal spray Place 2 sprays into both nostrils as needed.    pantoprazole (PROTONIX) 40 MG  tablet TAKE 1 TABLET(40 MG) BY MOUTH DAILY   pravastatin (PRAVACHOL) 40 MG tablet TAKE 1 TABLET BY MOUTH EVERY DAY   rizatriptan (MAXALT-MLT) 10 MG disintegrating tablet Take 1 tablet (10 mg total) by mouth as needed (max 2 doses in 24 hours). May repeat in 2 hours if needed   vitamin C (ASCORBIC ACID) 500 MG tablet Take 500 mg by mouth daily.   No facility-administered encounter medications on file as of 12/26/2022.    Allergies (verified) Crestor [rosuvastatin calcium], Fosamax [alendronate sodium], Oxycodone-acetaminophen, Prednisone, Sumatriptan, and Zofran [ondansetron]   History: Past Medical History:  Diagnosis Date   Allergy    seasonal   Atrial fibrillation    Family history of cancer    Headache    Hearing loss    in L ear   History of migraines    MRI -head,secondary H?A L_OCCIP within normal limits 07/1999//CT of head without Normal [facility]) 12/21/2007   Hyperlipidemia    NSVD (normal spontaneous vaginal delivery)    x 2   Osteoporosis    Past Surgical History:  Procedure Laterality Date   ABDOMINAL HYSTERECTOMY  11/99   partial hysterectomy ovaries intact (fibroids   COLONOSCOPY WITH PROPOFOL N/A 01/13/2018   Procedure: COLONOSCOPY WITH PROPOFOL;  Surgeon: Lollie Sails, MD;  Location: [facility] ENDOSCOPY;  Service: Endoscopy;  Laterality:  N/A;   ESOPHAGOGASTRODUODENOSCOPY (EGD) WITH PROPOFOL N/A 01/13/2018   Procedure: ESOPHAGOGASTRODUODENOSCOPY (EGD) WITH PROPOFOL;  Surgeon: Lollie Sails, MD;  Location: [facility] ENDOSCOPY;  Service: Endoscopy;  Laterality: N/A;   normal vaginal delivery x2 [facility] hospital     stress cardiolite  04/28/2001   stress cardiolite wnl, EF 69%   TUBAL LIGATION  1969   Family History  Problem Relation Age of Onset   Heart disease Mother 87       CHF   Cancer Mother 45       lung cancer since her 27's with kidney cancer //cancer of the throat and tongue //never smoked or drank   Diabetes Mother        borderline   Cancer  Father 54       melanoma   Cancer Sister        breast cancer and ovarian   Breast cancer Sister    Ovarian cancer Sister    Bone cancer Sister    Diabetes Brother    Lung cancer Brother    Stroke Maternal Grandmother    Depression Maternal Grandfather    Heart disease Paternal Grandfather        MI   Colon cancer Neg Hx    Social History   Socioeconomic History   Marital status: Married    Spouse name: Not on file   Number of children: 2   Years of education: Not on file   Highest education level: Not on file  Occupational History   Occupation: Retired    Fish farm manager: RETIRED    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   Retired.   Social Determinants of Health   Financial Resource Strain: Low Risk  (12/26/2022)   Overall Financial Resource Strain (CARDIA)    Difficulty of Paying Living Expenses: Not hard at all  Food Insecurity: No Food Insecurity (12/26/2022)   Hunger Vital Sign    Worried About Running Out of Food in the Last Year: Never true    Ran Out of Food in the Last Year: Never true  Transportation Needs: No Transportation Needs (12/26/2022)   PRAPARE - Hydrologist (Medical): No    Lack of Transportation (Non-Medical): No  Physical Activity: Inactive (12/26/2022)   Exercise Vital Sign    Days of Exercise per Week: 0 days    Minutes of Exercise per Session: 0 min  Stress: No Stress Concern Present  (12/26/2022)   [place]    Feeling of Stress : Not at all  Social Connections: Moderately Integrated (12/26/2022)   Social Connection and Isolation Panel [NHANES]    Frequency of Communication with Friends and Family: More than three times a week    Frequency  of Social Gatherings with Friends and Family: More than three times a week    Attends Religious Services: More than 4 times per year    Active Member of [employer] or Organizations: No    Attends Music therapist: Never    Marital Status: Married    Tobacco Counseling Counseling given: Not Answered Tobacco comments: quit in [place]:  Pre-visit preparation completed: Yes  Pain : 0-10 Pain Score: 2  Pain Type: Acute pain Pain Location: Back Pain Orientation: Left Pain Descriptors / Indicators: Sharp Pain Onset: More than a month ago Pain Frequency: Constant     Nutritional Risks: None Diabetes: No  How often do you need to have someone help you when you read instructions, pamphlets, or other written materials from your doctor or pharmacy?: 1 - Never  Diabetic? no  Interpreter Needed?: No  Information entered by :: C.Meha Vidrine LPN   Activities of Daily Living    12/26/2022   10:00 AM  In your present state of health, do you have any difficulty performing the following activities:  Hearing? 0  Vision? 0  Difficulty concentrating or making decisions? 0  Walking or climbing stairs? 0  Dressing or bathing? 0  Doing errands, shopping? 0  Preparing Food and eating ? N  Using the Toilet? N  In the past six months, have you accidently leaked urine? Y  Comment Occasional  Do you have problems with loss of bowel control? N  Managing your Medications? N  Managing your Finances? N  Housekeeping or managing your Housekeeping? N    Patient Care Team: Tonia Ghent, MD as PCP - General (Family Medicine) Wellington Hampshire, MD  as PCP - Cardiology (Cardiology)  Indicate any recent Medical Services you may have received from other than [facility] providers in the past year (date may be approximate).     Assessment:   This is a routine wellness examination for [place].  Hearing/Vision screen Hearing Screening - Comments:: No aids Vision Screening - Comments:: Glasses - [facility]  Dietary issues and exercise activities discussed: Current Exercise Habits: The patient does not participate in regular exercise at present, Exercise limited by: None identified   Goals Addressed             This Visit's Progress    Patient Stated       Exercise more to build energy and strength.       Depression Screen    12/26/2022    9:59 AM 12/07/2021    9:53 AM 10/20/2020   12:10 PM 06/14/2019    9:35 AM 06/02/2018    9:37 AM 02/20/2018    8:52 AM 06/13/2016   11:27 AM  PHQ 2/9 Scores  PHQ - 2 Score 0 0 0 0 0 0 0  PHQ- 9 Score   0  0      Fall Risk    12/26/2022   10:00 AM 12/07/2021   10:09 AM 12/07/2021    9:52 AM 10/20/2020   12:02 PM 06/14/2019    9:35 AM  Fall Risk   Falls in the past year? 0 0 0 0 0  Number falls in past yr: 0 0 0 0   Injury with Fall? 0 0 0 0   Risk for fall due to : No Fall Risks No Fall Risks No Fall Risks Medication side effect   Follow up Falls prevention discussed;Falls evaluation completed Falls prevention discussed Falls prevention discussed Falls prevention discussed;Falls evaluation  completed     FALL RISK PREVENTION PERTAINING TO THE HOME:  Any stairs in or around the home? No  If so, are there any without handrails? No  Home free of loose throw rugs in walkways, pet beds, electrical cords, etc? Yes  Adequate lighting in your home to reduce risk of falls? Yes   ASSISTIVE DEVICES UTILIZED TO PREVENT FALLS:  Life alert? No  Use of a cane, walker or w/c? No  Grab bars in the bathroom? No  Shower chair or bench in shower? No  Elevated toilet seat or a handicapped toilet? Yes     Cognitive Function:    10/20/2020   12:04 PM 06/02/2018    9:39 AM 06/13/2016   11:27 AM  MMSE - Mini Mental State Exam  Not completed: Unable to complete    Orientation to time  5 5  Orientation to Place  5 5  Registration  3 3  Attention/ Calculation  0 0  Recall  2 3  Recall-comments  unable to recall 1 of 3 words   Language- name 2 objects  0 0  Language- repeat  1 1  Language- follow 3 step command  3 3  Language- read & follow direction  0 0  Write a sentence  0 0  Copy design  0 0  Total score  19 20        12/26/2022   10:01 AM  6CIT Screen  What Year? 0 points  What month? 0 points  What time? 0 points  Count back from 20 0 points  Months in reverse 0 points  Repeat phrase 0 points  Total Score 0 points    Immunizations Immunization History  Administered Date(s) Administered   Fluad Quad(high Dose 65+) 06/20/2022   Influenza Split 07/03/2012, 06/25/2013   Influenza, High Dose Seasonal PF 06/09/2017, 05/28/2019   Influenza,inj,Quad PF,6+ Mos 06/09/2018, 05/28/2019   Influenza-Unspecified 06/17/2014, 05/07/2016, 06/09/2017, 06/09/2018, 05/30/2020   PFIZER(Purple Top)SARS-COV-2 Vaccination 11/07/2019, 11/30/2019, 08/29/2020   Pneumococcal Conjugate-13 05/07/2016   Pneumococcal Polysaccharide-23 10/29/2013   Td 10/11/1999   Tdap 03/29/2011, 08/04/2018    TDAP status: Up to date  Flu Vaccine status: Up to date  Pneumococcal vaccine status: Up to date  Covid-19 vaccine status: Declined, Education has been provided regarding the importance of this vaccine but patient still declined. Advised may receive this vaccine at local pharmacy or Health Dept.or vaccine clinic. Aware to provide a copy of the vaccination record if obtained from local pharmacy or Health Dept. Verbalized acceptance and understanding.  Qualifies for Shingles Vaccine? Yes   Zostavax completed  unknown   Shingrix Completed?: No.    Education has been provided regarding the importance of  this vaccine. Patient has been advised to call insurance company to determine out of pocket expense if they have not yet received this vaccine. Advised may also receive vaccine at local pharmacy or Health Dept. Verbalized acceptance and understanding.  Screening Tests Health Maintenance  Topic Date Due   Zoster Vaccines- Shingrix (1 of 2) Never done   INFLUENZA VACCINE  04/24/2023   Medicare Annual Wellness (AWV)  12/26/2023   DTaP/Tdap/Td (4 - Td or Tdap) 08/04/2028   Pneumonia Vaccine 55+ Years old  Completed   DEXA SCAN  Completed   Hepatitis C Screening  Completed   HPV VACCINES  Aged Out   COVID-19 Vaccine  Discontinued   Fecal DNA (Cologuard)  Discontinued    Health Maintenance  Health Maintenance Due  Topic Date  Due   Zoster Vaccines- Shingrix (1 of 2) Never done    Colorectal cancer screening: No longer required.   Mammogram status: Completed 05/28/22. Repeat every year  Bone Density status: Completed 07/02/22. Results reflect: Bone density results: OSTEOPOROSIS. Repeat every 2 years.  Lung Cancer Screening: (Low Dose CT Chest recommended if Age 32-80 years, 30 pack-year currently smoking OR have quit w/in 15years.) does not qualify.   Lung Cancer Screening Referral: no  Additional Screening:  Hepatitis C Screening: does qualify; Completed 06/17/16  Vision Screening: Recommended annual ophthalmology exams for early detection of glaucoma and other disorders of the eye. Is the patient up to date with their annual eye exam?  Yes  Who is the provider or what is the name of the office in which the patient attends annual eye exams? [facility] If pt is not established with a provider, would they like to be referred to a provider to establish care? No .   Dental Screening: Recommended annual dental exams for proper oral hygiene  Community Resource Referral / Chronic Care Management: CRR required this visit?  No   CCM required this visit?  No      Plan:     I  have personally reviewed and noted the following in the patient's chart:   Medical and social history Use of alcohol, tobacco or illicit drugs  Current medications and supplements including opioid prescriptions. Patient is not currently taking opioid prescriptions. Functional ability and status Nutritional status Physical activity Advanced directives List of other physicians Hospitalizations, surgeries, and ER visits in previous 12 months Vitals Screenings to include cognitive, depression, and falls Referrals and appointments  In addition, I have reviewed and discussed with patient certain preventive protocols, quality metrics, and best practice recommendations. A written personalized care plan for preventive services as well as general preventive health recommendations were provided to patient.     Lebron Conners, LPN   [id number]   Nurse Notes: none

## 2022-12-26 NOTE — Patient Instructions (Addendum)
Rebecca Moran , Thank you for taking time to come for your Medicare Wellness Visit. I appreciate your ongoing commitment to your health goals. Please review the following plan we discussed and let me know if I can assist you in the future.   These are the goals we discussed:  Goals      Increase physical activity     Starting 06/02/2018, I will continue to walk for 10-20 minutes daily.      Patient Stated     10/20/2020, I will maintain and continue medications as prescribed.     Patient Stated     Would like to maintain current routine     Patient Stated     Exercise more to build energy and strength.        This is a list of the screening recommended for you and due dates:  Health Maintenance  Topic Date Due   Zoster (Shingles) Vaccine (1 of 2) Never done   Flu Shot  04/24/2023   Medicare Annual Wellness Visit  12/26/2023   DTaP/Tdap/Td vaccine (4 - Td or Tdap) 08/04/2028   Pneumonia Vaccine  Completed   DEXA scan (bone density measurement)  Completed   Hepatitis C Screening: USPSTF Recommendation to screen - Ages 69-79 yo.  Completed   HPV Vaccine  Aged Out   COVID-19 Vaccine  Discontinued   Cologuard (Stool DNA test)  Discontinued    Advanced directives: Please bring a copy of your health care power of attorney and living will to the office to be added to your chart at your convenience.   Conditions/risks identified: Aim for 30 minutes of exercise or brisk walking, 6-8 glasses of water, and 5 servings of fruits and vegetables each day.   Next appointment: Follow up in one year for your annual wellness visit 12/29/23 @ 1:30 telephone visit.   Preventive Care 57 Years and Older, Female Preventive care refers to lifestyle choices and visits with your health care provider that can promote health and wellness. What does preventive care include? A yearly physical exam. This is also called an annual well check. Dental exams once or twice a year. Routine eye exams. Ask your  health care provider how often you should have your eyes checked. Personal lifestyle choices, including: Daily care of your teeth and gums. Regular physical activity. Eating a healthy diet. Avoiding tobacco and drug use. Limiting alcohol use. Practicing safe sex. Taking low-dose aspirin every day. Taking vitamin and mineral supplements as recommended by your health care provider. What happens during an annual well check? The services and screenings done by your health care provider during your annual well check will depend on your age, overall health, lifestyle risk factors, and family history of disease. Counseling  Your health care provider may ask you questions about your: Alcohol use. Tobacco use. Drug use. Emotional well-being. Home and relationship well-being. Sexual activity. Eating habits. History of falls. Memory and ability to understand (cognition). Work and work Statistician. Reproductive health. Screening  You may have the following tests or measurements: Height, weight, and BMI. Blood pressure. Lipid and cholesterol levels. These may be checked every 5 years, or more frequently if you are over 91 years old. Skin check. Lung cancer screening. You may have this screening every year starting at age 44 if you have a 30-pack-year history of smoking and currently smoke or have quit within the past 15 years. Fecal occult blood test (FOBT) of the stool. You may have this test every year starting  at age 66. Flexible sigmoidoscopy or colonoscopy. You may have a sigmoidoscopy every 5 years or a colonoscopy every 10 years starting at age 20. Hepatitis C blood test. Hepatitis B blood test. Sexually transmitted disease (STD) testing. Diabetes screening. This is done by checking your blood sugar (glucose) after you have not eaten for a while (fasting). You may have this done every 1-3 years. Bone density scan. This is done to screen for osteoporosis. You may have this done  starting at age 13. Mammogram. This may be done every 1-2 years. Talk to your health care provider about how often you should have regular mammograms. Talk with your health care provider about your test results, treatment options, and if necessary, the need for more tests. Vaccines  Your health care provider may recommend certain vaccines, such as: Influenza vaccine. This is recommended every year. Tetanus, diphtheria, and acellular pertussis (Tdap, Td) vaccine. You may need a Td booster every 10 years. Zoster vaccine. You may need this after age 38. Pneumococcal 13-valent conjugate (PCV13) vaccine. One dose is recommended after age 54. Pneumococcal polysaccharide (PPSV23) vaccine. One dose is recommended after age 73. Talk to your health care provider about which screenings and vaccines you need and how often you need them. This information is not intended to replace advice given to you by your health care provider. Make sure you discuss any questions you have with your health care provider. Document Released: 10/06/2015 Document Revised: 05/29/2016 Document Reviewed: 07/11/2015 Elsevier Interactive Patient Education  2017 [employer] Prevention in the Home Falls can cause injuries. They can happen to people of all ages. There are many things you can do to make your home safe and to help prevent falls. What can I do on the outside of my home? Regularly fix the edges of walkways and driveways and fix any cracks. Remove anything that might make you trip as you walk through a door, such as a raised step or threshold. Trim any bushes or trees on the path to your home. Use bright outdoor lighting. Clear any walking paths of anything that might make someone trip, such as rocks or tools. Regularly check to see if handrails are loose or broken. Make sure that both sides of any steps have handrails. Any raised decks and porches should have guardrails on the edges. Have any leaves, snow, or  ice cleared regularly. Use sand or salt on walking paths during winter. Clean up any spills in your garage right away. This includes oil or grease spills. What can I do in the bathroom? Use night lights. Install grab bars by the toilet and in the tub and shower. Do not use towel bars as grab bars. Use non-skid mats or decals in the tub or shower. If you need to sit down in the shower, use a plastic, non-slip stool. Keep the floor dry. Clean up any water that spills on the floor as soon as it happens. Remove soap buildup in the tub or shower regularly. Attach bath mats securely with double-sided non-slip rug tape. Do not have throw rugs and other things on the floor that can make you trip. What can I do in the bedroom? Use night lights. Make sure that you have a light by your bed that is easy to reach. Do not use any sheets or blankets that are too big for your bed. They should not hang down onto the floor. Have a firm chair that has side arms. You can use this for support  while you get dressed. Do not have throw rugs and other things on the floor that can make you trip. What can I do in the kitchen? Clean up any spills right away. Avoid walking on wet floors. Keep items that you use a lot in easy-to-reach places. If you need to reach something above you, use a strong step stool that has a grab bar. Keep electrical cords out of the way. Do not use floor polish or wax that makes floors slippery. If you must use wax, use non-skid floor wax. Do not have throw rugs and other things on the floor that can make you trip. What can I do with my stairs? Do not leave any items on the stairs. Make sure that there are handrails on both sides of the stairs and use them. Fix handrails that are broken or loose. Make sure that handrails are as long as the stairways. Check any carpeting to make sure that it is firmly attached to the stairs. Fix any carpet that is loose or worn. Avoid having throw rugs at  the top or bottom of the stairs. If you do have throw rugs, attach them to the floor with carpet tape. Make sure that you have a light switch at the top of the stairs and the bottom of the stairs. If you do not have them, ask someone to add them for you. What else can I do to help prevent falls? Wear shoes that: Do not have high heels. Have rubber bottoms. Are comfortable and fit you well. Are closed at the toe. Do not wear sandals. If you use a stepladder: Make sure that it is fully opened. Do not climb a closed stepladder. Make sure that both sides of the stepladder are locked into place. Ask someone to hold it for you, if possible. Clearly mark and make sure that you can see: Any grab bars or handrails. First and last steps. Where the edge of each step is. Use tools that help you move around (mobility aids) if they are needed. These include: Canes. Walkers. Scooters. Crutches. Turn on the lights when you go into a dark area. Replace any light bulbs as soon as they burn out. Set up your furniture so you have a clear path. Avoid moving your furniture around. If any of your floors are uneven, fix them. If there are any pets around you, be aware of where they are. Review your medicines with your doctor. Some medicines can make you feel dizzy. This can increase your chance of falling. Ask your doctor what other things that you can do to help prevent falls. This information is not intended to replace advice given to you by your health care provider. Make sure you discuss any questions you have with your health care provider. Document Released: 07/06/2009 Document Revised: 02/15/2016 Document Reviewed: 10/14/2014 Elsevier Interactive Patient Education  2017 [employer].

## 2022-12-27 DIAGNOSIS — R319 Hematuria, unspecified: Secondary | ICD-10-CM | POA: Insufficient documentation

## 2022-12-27 NOTE — Assessment & Plan Note (Signed)
With history of renal stones noted.  Check urine culture and KUB.  See notes on labs.  She was due for routine follow-up labs anyway so we will get those collected.  Continue liberal oral fluids and Tylenol for pain.

## 2022-12-28 LAB — URINE CULTURE
MICRO NUMBER:: 14783071
SPECIMEN QUALITY:: ADEQUATE

## 2022-12-29 ENCOUNTER — Other Ambulatory Visit: Payer: Self-pay | Admitting: Family Medicine

## 2022-12-29 MED ORDER — CEPHALEXIN 500 MG PO CAPS: 500.0000 mg | ORAL_CAPSULE | Freq: Four times a day (QID) | ORAL | 0 refills | Status: DC

## 2022-12-30 ENCOUNTER — Other Ambulatory Visit: Payer: Self-pay | Admitting: Family Medicine

## 2023-01-02 ENCOUNTER — Ambulatory Visit (INDEPENDENT_AMBULATORY_CARE_PROVIDER_SITE_OTHER): Payer: Medicare Other | Admitting: Family Medicine

## 2023-01-02 ENCOUNTER — Encounter: Payer: Self-pay | Admitting: Family Medicine

## 2023-01-02 VITALS — BP 110/78 | HR 72 | Temp 97.4°F | Ht 65.5 in | Wt 191.0 lb

## 2023-01-02 DIAGNOSIS — G43909 Migraine, unspecified, not intractable, without status migrainosus: Secondary | ICD-10-CM | POA: Diagnosis not present

## 2023-01-02 DIAGNOSIS — N390 Urinary tract infection, site not specified: Secondary | ICD-10-CM | POA: Diagnosis not present

## 2023-01-02 DIAGNOSIS — E7800 Pure hypercholesterolemia, unspecified: Secondary | ICD-10-CM

## 2023-01-02 DIAGNOSIS — I4891 Unspecified atrial fibrillation: Secondary | ICD-10-CM

## 2023-01-02 DIAGNOSIS — Z7189 Other specified counseling: Secondary | ICD-10-CM

## 2023-01-02 DIAGNOSIS — Z0000 Encounter for general adult medical examination without abnormal findings: Secondary | ICD-10-CM

## 2023-01-02 DIAGNOSIS — M810 Age-related osteoporosis without current pathological fracture: Secondary | ICD-10-CM

## 2023-01-02 MED ORDER — APIXABAN 5 MG PO TABS: ORAL_TABLET | ORAL | 12 refills | Status: DC

## 2023-01-02 NOTE — Progress Notes (Signed)
Back pain is better.  Prev ucx with E coli.  Tx with keflex. No ADE on med.    AF.  Compliant with eliquis.   Using medication without problems or lightheadedness: yes Chest pain with exertion:no Edema:no Labs d/w pt.   No sensation of skipped beats.   Has cardiology f/u pending.   Elevated Cholesterol: Using medications without problems: yes Muscle aches: no Diet compliance: d/w pt Exercise: d/w pt  Much less migraines since retirement.  Doing well.  Noted during periods of high stress, ie when her sib passed.    Flu vaccine - done prev.  PNA up to date.   Shingles - d/w pt.   Tetanus 2019 covid vaccine up to date.   Hep C screening - negative prev, d/w pt.  cologuard 2022 Mammogram 2023 DXA 2023, d/w pt about options.  She was concerned about hearing loss but I don't see that listed as a side effect.  I asked her to check with the dental clinic to see if they have any concerns about her starting treatment. Advance directive- would have husband designated if she were incapacitated.  If husband were incapacitated, then would have daughters Kathe Becton and French Ana equally designated.    PMH and SH reviewed  Meds, vitals, and allergies reviewed.   ROS: Per HPI unless specifically indicated in ROS section   GEN: nad, alert and oriented HEENT: ncat NECK: supple w/o LA CV: rrr. PULM: ctab, no inc wob ABD: soft, +bs EXT: no edema SKIN: no acute rash

## 2023-01-02 NOTE — Patient Instructions (Addendum)
If the back pain doesn't resolve, then let me know.  I would finish the antibiotics.   Take care.  Glad to see you. Ask the dental clinic about possible prolia use.

## 2023-01-05 DIAGNOSIS — N390 Urinary tract infection, site not specified: Secondary | ICD-10-CM | POA: Insufficient documentation

## 2023-01-05 NOTE — Assessment & Plan Note (Signed)
Advance directive- would have husband designated if she were incapacitated. If husband were incapacitated, then would have daughters Robbin and Tracy equally designated.   

## 2023-01-05 NOTE — Assessment & Plan Note (Signed)
Continue work on diet and exercise.  Continue pravastatin. 

## 2023-01-05 NOTE — Assessment & Plan Note (Signed)
Much less migraines since retirement.  Doing well.  Noted during periods of high stress, ie when her sib passed.  She will update me as needed.

## 2023-01-05 NOTE — Assessment & Plan Note (Signed)
DXA 2023, d/w pt about options.  She was concerned about hearing loss but I don't see that listed as a side effect.  I asked her to check with the dental clinic to see if they have any concerns about her starting treatment.

## 2023-01-05 NOTE — Assessment & Plan Note (Signed)
Flu vaccine - done prev.  PNA up to date.   Shingles - d/w pt.   Tetanus 2019 covid vaccine up to date.   Hep C screening - negative prev, d/w pt.  cologuard 2022 Mammogram 2023 DXA 2023, d/w pt about options.  She was concerned about hearing loss but I don't see that listed as a side effect.  I asked her to check with the dental clinic to see if they have any concerns about her starting treatment. Advance directive- would have husband designated if she were incapacitated.  If husband were incapacitated, then would have daughters Kathe Becton and French Ana equally designated.

## 2023-01-05 NOTE — Assessment & Plan Note (Signed)
Back pain is better.  Prev ucx with E coli.  Tx with keflex. No ADE on med.  She can update me as needed.

## 2023-01-05 NOTE — Assessment & Plan Note (Signed)
No chest pain has cardiology follow-up pending.  Continue Eliquis.  She will update me as needed.

## 2023-01-08 DIAGNOSIS — R0602 Shortness of breath: Secondary | ICD-10-CM | POA: Diagnosis not present

## 2023-01-08 DIAGNOSIS — I480 Paroxysmal atrial fibrillation: Secondary | ICD-10-CM | POA: Diagnosis not present

## 2023-05-15 ENCOUNTER — Other Ambulatory Visit: Payer: Self-pay | Admitting: Family Medicine

## 2023-05-15 DIAGNOSIS — Z1231 Encounter for screening mammogram for malignant neoplasm of breast: Secondary | ICD-10-CM

## 2023-05-29 ENCOUNTER — Encounter: Payer: Self-pay | Admitting: Family Medicine

## 2023-05-29 ENCOUNTER — Ambulatory Visit (INDEPENDENT_AMBULATORY_CARE_PROVIDER_SITE_OTHER): Payer: Medicare Other | Admitting: Family Medicine

## 2023-05-29 VITALS — BP 130/80 | HR 70 | Temp 97.8°F | Ht 65.5 in

## 2023-05-29 DIAGNOSIS — J0100 Acute maxillary sinusitis, unspecified: Secondary | ICD-10-CM | POA: Diagnosis not present

## 2023-05-29 MED ORDER — RIZATRIPTAN BENZOATE 10 MG PO TBDP: 10.0000 mg | ORAL_TABLET | ORAL | Status: AC | PRN

## 2023-05-29 MED ORDER — AMOXICILLIN-POT CLAVULANATE 875-125 MG PO TABS: 1.0000 | ORAL_TABLET | Freq: Two times a day (BID) | ORAL | 0 refills | Status: DC

## 2023-05-29 NOTE — Progress Notes (Signed)
Sick for over 2 weeks with neg covid test.  Husband had been sick concurrently.  Using flonase and neti pot.  Diffuse discolored rhinorrhea.  Post nasal gtt.  Head congestion.  Facial pain.  R ear pain.  No fevers.  Had dry heaves about 2 weeks ago ,that resolved in the meantime.    Meds, vitals, and allergies reviewed.   ROS: Per HPI unless specifically indicated in ROS section   GEN: nad, alert and oriented HEENT: mucous membranes moist, TM w/o erythema, sinuses ttp x4 NECK: supple w/o LA CV: rrr. PULM: ctab, no inc wob ABD: soft, +bs EXT: no edema

## 2023-05-29 NOTE — Patient Instructions (Signed)
Start augmentin, rest and fluids, update [place] as needed.  Take care.  Glad to see you.

## 2023-06-01 DIAGNOSIS — J0100 Acute maxillary sinusitis, unspecified: Secondary | ICD-10-CM | POA: Insufficient documentation

## 2023-06-01 NOTE — Assessment & Plan Note (Signed)
Start augmentin, rest and fluids, update [place] as needed.  Okay for outpatient follow-up.  She agrees to plan.

## 2023-06-10 ENCOUNTER — Ambulatory Visit
Admission: RE | Admit: 2023-06-10 | Discharge: 2023-06-10 | Disposition: A | Discharge status: Home / Self Care | Payer: Medicare Other | Source: Ambulatory Visit | Attending: Family Medicine | Admitting: Family Medicine

## 2023-06-10 DIAGNOSIS — Z1231 Encounter for screening mammogram for malignant neoplasm of breast: Secondary | ICD-10-CM | POA: Insufficient documentation

## 2023-07-07 DIAGNOSIS — H2513 Age-related nuclear cataract, bilateral: Secondary | ICD-10-CM | POA: Diagnosis not present

## 2023-07-10 DIAGNOSIS — I480 Paroxysmal atrial fibrillation: Secondary | ICD-10-CM | POA: Diagnosis not present

## 2023-07-10 DIAGNOSIS — R0602 Shortness of breath: Secondary | ICD-10-CM | POA: Diagnosis not present

## 2023-07-10 DIAGNOSIS — R079 Chest pain, unspecified: Secondary | ICD-10-CM | POA: Diagnosis not present

## 2023-11-15 ENCOUNTER — Other Ambulatory Visit: Payer: Self-pay | Admitting: Family Medicine

## 2023-12-12 ENCOUNTER — Other Ambulatory Visit: Payer: Self-pay | Admitting: Family Medicine

## 2023-12-29 ENCOUNTER — Ambulatory Visit (INDEPENDENT_AMBULATORY_CARE_PROVIDER_SITE_OTHER): Payer: Medicare Other

## 2023-12-29 VITALS — BP 130/80 | Ht 65.5 in | Wt 190.0 lb

## 2023-12-29 DIAGNOSIS — Z2821 Immunization not carried out because of patient refusal: Secondary | ICD-10-CM | POA: Diagnosis not present

## 2023-12-29 DIAGNOSIS — Z0000 Encounter for general adult medical examination without abnormal findings: Secondary | ICD-10-CM | POA: Diagnosis not present

## 2023-12-29 NOTE — Patient Instructions (Signed)
 Ms. Trafton , Thank you for taking time to come for your Medicare Wellness Visit. I appreciate your ongoing commitment to your health goals. Please review the following plan we discussed and let me know if I can assist you in the future.   Referrals/Orders/Follow-Ups/Clinician Recommendations: follow up in 1 year for next Medicare annual wellness.  This is a list of the screening recommended for you and due dates:  Health Maintenance  Topic Date Due   Zoster (Shingles) Vaccine (1 of 2) Never done   Flu Shot  04/23/2024   Medicare Annual Wellness Visit  12/28/2024   DTaP/Tdap/Td vaccine (4 - Td or Tdap) 08/04/2028   Pneumonia Vaccine  Completed   DEXA scan (bone density measurement)  Completed   Hepatitis C Screening  Completed   HPV Vaccine  Aged Out   COVID-19 Vaccine  Discontinued   Cologuard (Stool DNA test)  Discontinued    Advanced directives: (Declined) Advance directive discussed with you today. Even though you declined this today, please call our office should you change your mind, and we can give you the proper paperwork for you to fill out.  Next Medicare Annual Wellness Visit scheduled for next year: Yes

## 2023-12-29 NOTE — Progress Notes (Signed)
 Because this visit was a virtual/telehealth visit,  certain criteria was not obtained, such a blood pressure, CBG if applicable, and timed get up and go. Any medications not marked as "taking" were not mentioned during the medication reconciliation part of the visit. Any vitals not documented were not able to be obtained due to this being a telehealth visit or patient was unable to self-report a recent blood pressure reading due to a lack of equipment at home via telehealth. Vitals that have been documented are verbally provided by the patient.   Subjective:   Rebecca Moran is a 78 y.o. who presents for a Medicare Wellness preventive visit.  Visit Complete: Virtual I connected with  MARKEIA HARKLESS on 12/29/23 by a audio enabled telemedicine application and verified that I am speaking with the correct person using two identifiers.  Patient Location: Home  Provider Location: Home Office  I discussed the limitations of evaluation and management by telemedicine. The patient expressed understanding and agreed to proceed.  Vital Signs: Because this visit was a virtual/telehealth visit, some criteria may be missing or patient reported. Any vitals not documented were not able to be obtained and vitals that have been documented are patient reported.  VideoDeclined- This patient declined Librarian, academic. Therefore the visit was completed with audio only.  Persons Participating in Visit: Patient.  AWV Questionnaire: No: Patient Medicare AWV questionnaire was not completed prior to this visit.  Cardiac Risk Factors include: advanced age (>65men, >21 women);obesity (BMI >30kg/m2);hypertension;Other (see comment), Risk factor comments: Afib     Objective:    Today's Vitals   12/29/23 1357  BP: 130/80  Weight: 190 lb (86.2 kg)  Height: 5' 5.5" (1.664 m)   Body mass index is 31.14 kg/m.     12/29/2023    1:55 PM 12/26/2022    9:59 AM 12/07/2021    9:49 AM  09/16/2021   10:38 AM 10/20/2020   12:10 PM 08/04/2018    5:18 AM 06/02/2018    9:40 AM  Advanced Directives  Does Patient Have a Medical Advance Directive? No Yes Yes No No No No  Type of Special educational needs teacher of [place];Living will Living will      Does patient want to make changes to medical advance directive?   Yes (MAU/Ambulatory/Procedural Areas - Information given)      Copy of Healthcare Power of Attorney in Chart?  No - copy requested       Would patient like information on creating a medical advance directive? No - Patient declined    No - Patient declined No - Patient declined No - Patient declined    Current Medications (verified) Outpatient Encounter Medications as of 12/29/2023  Medication Sig   apixaban (ELIQUIS) 5 MG TABS tablet TAKE 1 TABLET(5 MG) BY MOUTH TWICE DAILY   cetirizine (ZYRTEC) 10 MG tablet Take 10 mg by mouth daily.   Cholecalciferol (VITAMIN D3) 50 MCG (2000 UT) capsule Take 1 capsule (2,000 Units total) by mouth daily.   fluticasone (FLONASE) 50 MCG/ACT nasal spray Place 2 sprays into both nostrils as needed.    pantoprazole (PROTONIX) 40 MG tablet TAKE 1 TABLET(40 MG) BY MOUTH DAILY   pravastatin (PRAVACHOL) 40 MG tablet TAKE 1 TABLET BY MOUTH EVERY DAY   rizatriptan (MAXALT-MLT) 10 MG disintegrating tablet Take 1 tablet (10 mg total) by mouth as needed (max 2 doses in 24 hours). May repeat in 2 hours if needed   vitamin C (ASCORBIC ACID)  500 MG tablet Take 500 mg by mouth daily.   amoxicillin-clavulanate (AUGMENTIN) 875-125 MG tablet Take 1 tablet by mouth 2 (two) times daily. (Patient not taking: Reported on 12/29/2023)   No facility-administered encounter medications on file as of 12/29/2023.    Allergies (verified) Crestor [rosuvastatin calcium], Fosamax [alendronate sodium], Oxycodone-acetaminophen, Prednisone, Sumatriptan, and Zofran [ondansetron]   History: Past Medical History:  Diagnosis Date   Allergy    seasonal   Atrial  fibrillation (HCC)    Family history of cancer    Headache    Hearing loss    in L ear   History of migraines    MRI -head,secondary H?A L_OCCIP within normal limits 07/1999//CT of head without Normal [facility]) 12/21/2007   Hyperlipidemia    NSVD (normal spontaneous vaginal delivery)    x 2   Osteoporosis    Past Surgical History:  Procedure Laterality Date   ABDOMINAL HYSTERECTOMY  11/99   partial hysterectomy ovaries intact (fibroids   COLONOSCOPY WITH PROPOFOL N/A 01/13/2018   Procedure: COLONOSCOPY WITH PROPOFOL;  Surgeon: [name], MD;  Location: [facility] ENDOSCOPY;  Service: Endoscopy;  Laterality: N/A;   ESOPHAGOGASTRODUODENOSCOPY (EGD) WITH PROPOFOL N/A 01/13/2018   Procedure: ESOPHAGOGASTRODUODENOSCOPY (EGD) WITH PROPOFOL;  Surgeon: [name], MD;  Location: [facility] ENDOSCOPY;  Service: Endoscopy;  Laterality: N/A;   normal vaginal delivery x2 [facility] hospital     stress cardiolite  04/28/2001   stress cardiolite wnl, EF 69%   TUBAL LIGATION  1969   Family History  Problem Relation Age of Onset   Heart disease Mother 63       CHF   Cancer Mother 28       lung cancer since her 76's with kidney cancer //cancer of the throat and tongue //never smoked or drank   Diabetes Mother        borderline   Cancer Father 68       melanoma   Cancer Sister        breast cancer and ovarian   Breast cancer Sister    Ovarian cancer Sister    Bone cancer Sister    Diabetes Brother    Lung cancer Brother    Stroke Maternal Grandmother    Depression Maternal Grandfather    Heart disease Paternal Grandfather        MI   Colon cancer Neg Hx    Social History   Socioeconomic History   Marital status: Married    Spouse name: Not on file   Number of children: 2   Years of education: Not on file   Highest education level: Not on file  Occupational History   Occupation: Retired    Associate Professor: RETIRED    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  Retired.   Social Drivers of Corporate investment banker Strain: Low Risk  (12/29/2023)   Overall Financial Resource Strain (CARDIA)    Difficulty of Paying Living Expenses: Not hard at all  Food Insecurity: No Food Insecurity (12/29/2023)   Hunger Vital Sign    Worried About Running Out of Food in the Last Year: Never true    Ran Out of Food in the Last Year: Never true  Transportation Needs: No Transportation Needs (12/29/2023)   PRAPARE - Administrator, Civil Service (Medical): No    Lack of Transportation (Non-Medical): No  Physical Activity: Sufficiently Active (12/29/2023)   Exercise Vital Sign    Days of Exercise per Week: 7 days    Minutes of Exercise per Session: 30 min  Stress: No Stress Concern Present (12/29/2023)   [employer] of Occupational Health - Occupational Stress Questionnaire    Feeling of Stress : Not at all  Social Connections: Moderately Integrated (12/29/2023)   Social Connection and Isolation Panel [NHANES]    Frequency of Communication with Friends and Family: More than three times a week    Frequency of Social Gatherings with Friends and Family: More than three times a week    Attends Religious Services: More than 4 times per year    Active Member of [employer] or Organizations: No    Attends Engineer, structural: Never    Marital Status: Married    Tobacco Counseling Counseling given: Not  Answered Tobacco comments: quit in 1975    Clinical Intake:  Pre-visit preparation completed: Yes  Pain : No/denies pain     BMI - recorded: 31.14 Nutritional Status: BMI > 30  Obese Nutritional Risks: None Diabetes: No  No results found for: "HGBA1C"   How often do you need to have someone help you when you read instructions, pamphlets, or other written materials from your doctor or pharmacy?: 1 - Never  Interpreter Needed?: No  Information entered by :: [employer]   Activities of Daily Living     12/29/2023    2:06 PM  In your present state of health, do you have any difficulty performing the following activities:  Hearing? 0  Vision? 0  Difficulty concentrating or making decisions? 0  Walking or climbing stairs? 0  Dressing or bathing? 0  Doing errands, shopping? 0  Preparing Food and eating ? N  Using the Toilet? N  In the past six months, have you accidently leaked urine? Y  Do you have problems with loss of bowel control? N  Managing your Medications? N  Managing your Finances? N  Housekeeping or managing your Housekeeping? N    Patient Care Team: Joaquim Nam, MD as PCP - General (Family Medicine) Marcina Millard, MD as Consulting Physician (Cardiology)  Indicate any recent Medical Services you may have received from other than [facility] providers in the past year (date may be approximate).     Assessment:   This is a routine wellness examination for [place].  Hearing/Vision screen Hearing Screening - Comments:: Hearing loss in left ear Vision Screening - Comments:: Patient wears glasses    Goals Addressed             This Visit's Progress    Patient Stated       Patient would like to stay healthy       Depression Screen     12/29/2023    2:08 PM 05/29/2023    9:51 AM 01/02/2023   10:08  AM 12/26/2022    9:59 AM 12/07/2021    9:53 AM 10/20/2020   12:10 PM 06/14/2019    9:35 AM  PHQ 2/9 Scores  PHQ - 2 Score 2 0 0 0 0 0 0   PHQ- 9 Score 3 0 0   0     Fall Risk     12/29/2023    2:05 PM 05/29/2023    9:50 AM 01/02/2023   10:08 AM 12/26/2022   10:00 AM 12/07/2021   10:09 AM  Fall Risk   Falls in the past year? 0 0 0 0 0  Number falls in past yr: 0 0 0 0 0  Injury with Fall? 0 0 0 0 0  Risk for fall due to : No Fall Risks No Fall Risks No Fall Risks No Fall Risks No Fall Risks  Follow up Falls prevention discussed;Falls evaluation completed Falls evaluation completed Falls evaluation completed Falls prevention discussed;Falls evaluation completed Falls prevention discussed    MEDICARE RISK AT HOME:  Medicare Risk at Home Any stairs in or around the home?: No If so, are there any without handrails?: No Home free of loose throw rugs in walkways, pet beds, electrical cords, etc?: Yes Adequate lighting in your home to reduce risk of falls?: Yes Life alert?: No Use of a cane, walker or w/c?: No Grab bars in the bathroom?: No Shower chair or bench in shower?: No Elevated toilet seat or a handicapped toilet?: No  TIMED UP AND GO:  Was the test performed?  No  Cognitive Function: 6CIT completed    10/20/2020   12:04 PM 06/02/2018    9:39 AM 06/13/2016   11:27 AM  MMSE - Mini Mental State Exam  Not completed: Unable to complete    Orientation to time  5 5  Orientation to Place  5 5  Registration  3 3  Attention/ Calculation  0 0  Recall  2 3  Recall-comments  unable to recall 1 of 3 words   Language- name 2 objects  0 0  Language- repeat  1 1  Language- follow 3 step command  3 3  Language- read & follow direction  0 0  Write a sentence  0 0  Copy design  0 0  Total score  19 20        12/29/2023    2:00 PM 12/26/2022   10:01 AM  6CIT Screen  What Year? 0 points 0 points  What month? 0 points 0 points  What time? 0 points 0 points  Count back from 20 0 points 0 points  Months in reverse 0 points 0 points  Repeat phrase 0 points 0 points  Total Score 0 points 0 points     Immunizations Immunization History  Administered Date(s) Administered   Fluad Quad(high Dose 65+) 06/20/2022   Influenza Split 07/03/2012, 06/25/2013   Influenza, High Dose Seasonal PF 06/09/2017, 05/28/2019   Influenza,inj,Quad PF,6+ Mos 06/09/2018, 05/28/2019   Influenza-Unspecified 06/17/2014, 05/07/2016, 06/09/2017, 06/09/2018, 05/30/2020   PFIZER(Purple Top)SARS-COV-2 Vaccination 11/07/2019, 11/30/2019, 08/29/2020   Pneumococcal Conjugate-13 05/07/2016   Pneumococcal Polysaccharide-23 10/29/2013   Td 10/11/1999   Tdap 03/29/2011, 08/04/2018    Screening Tests Health Maintenance  Topic Date Due   Zoster Vaccines- Shingrix (1 of 2) Never done   INFLUENZA VACCINE  04/23/2024   Medicare Annual Wellness (AWV)  12/28/2024   DTaP/Tdap/Td (4 - Td or Tdap) 08/04/2028   Pneumonia Vaccine 90+ Years old  Completed   DEXA SCAN  Completed   Hepatitis C Screening  Completed   HPV VACCINES  Aged Out   COVID-19 Vaccine  Discontinued   Fecal DNA (Cologuard)  Discontinued    Health Maintenance  Health Maintenance Due  Topic Date Due   Zoster Vaccines- Shingrix (1 of 2) Never done   Health Maintenance Items Addressed:patient   Additional Screening:  Vision Screening: Recommended annual ophthalmology exams for early detection of glaucoma and other disorders of the eye.  Dental Screening: Recommended annual dental exams for proper oral hygiene  Community Resource Referral / Chronic Care Management: CRR required this visit?  No   CCM required this visit?  No     Plan:     I have personally reviewed and noted the following in the patient's chart:   Medical and social history Use of alcohol, tobacco or illicit drugs  Current medications and supplements including opioid prescriptions. Patient is not currently taking opioid prescriptions. Functional ability and status Nutritional status Physical activity Advanced directives List of other physicians Hospitalizations,  surgeries, and ER visits in previous 12 months Vitals Screenings to include cognitive, depression, and falls Referrals and appointments  In addition, I have reviewed and discussed with patient certain preventive protocols, quality metrics, and best practice recommendations. A written personalized care plan for preventive services as well as general preventive health recommendations were provided to patient.     Rudi Heap, [place]   12/29/2023   After Visit Summary: (MyChart) Due to this being a telephonic visit, the after visit summary with patients personalized plan was offered to patient via MyChart   Notes: Nothing significant to report at this time.

## 2024-01-08 ENCOUNTER — Other Ambulatory Visit: Payer: Self-pay | Admitting: Family Medicine

## 2024-01-08 DIAGNOSIS — I4891 Unspecified atrial fibrillation: Secondary | ICD-10-CM

## 2024-01-08 DIAGNOSIS — R0789 Other chest pain: Secondary | ICD-10-CM | POA: Diagnosis not present

## 2024-01-08 DIAGNOSIS — I480 Paroxysmal atrial fibrillation: Secondary | ICD-10-CM | POA: Diagnosis not present

## 2024-01-08 DIAGNOSIS — R0602 Shortness of breath: Secondary | ICD-10-CM | POA: Diagnosis not present

## 2024-01-12 ENCOUNTER — Telehealth: Payer: Self-pay

## 2024-01-12 NOTE — Telephone Encounter (Signed)
 Called pt and schedule AWV part 2 with Dr. Vallarie Gauze

## 2024-01-12 NOTE — Telephone Encounter (Signed)
-----   Message from Richrd Char sent at 01/11/2024 11:20 PM EDT ----- Regarding: RE: Appointment Please help her get scheduled for a yearly visit this spring, when possible.  Thanks.   Bernetta Brilliant ----- Message ----- From: Eller Gut, CMA Sent: 01/08/2024   8:34 AM EDT To: Donnie Galea, MD Subject: Appointment                                    I sent in a refill request for patient for 30 days because I did not want her to be without her blood thinner. I was seeing if I need to call have her schedule an appointment to continue refills. Please advise

## 2024-01-12 NOTE — Telephone Encounter (Signed)
 Please schedule AWV part 2 with Dr. Vallarie Gauze

## 2024-01-20 ENCOUNTER — Ambulatory Visit (INDEPENDENT_AMBULATORY_CARE_PROVIDER_SITE_OTHER): Admitting: Family Medicine

## 2024-01-20 ENCOUNTER — Encounter: Payer: Self-pay | Admitting: Family Medicine

## 2024-01-20 VITALS — BP 138/76 | HR 67 | Temp 97.6°F | Ht 65.4 in | Wt 193.0 lb

## 2024-01-20 DIAGNOSIS — Z1211 Encounter for screening for malignant neoplasm of colon: Secondary | ICD-10-CM

## 2024-01-20 DIAGNOSIS — E7800 Pure hypercholesterolemia, unspecified: Secondary | ICD-10-CM

## 2024-01-20 DIAGNOSIS — Z0000 Encounter for general adult medical examination without abnormal findings: Secondary | ICD-10-CM

## 2024-01-20 DIAGNOSIS — M810 Age-related osteoporosis without current pathological fracture: Secondary | ICD-10-CM | POA: Diagnosis not present

## 2024-01-20 DIAGNOSIS — G43909 Migraine, unspecified, not intractable, without status migrainosus: Secondary | ICD-10-CM

## 2024-01-20 DIAGNOSIS — I4891 Unspecified atrial fibrillation: Secondary | ICD-10-CM

## 2024-01-20 DIAGNOSIS — J301 Allergic rhinitis due to pollen: Secondary | ICD-10-CM | POA: Diagnosis not present

## 2024-01-20 DIAGNOSIS — K219 Gastro-esophageal reflux disease without esophagitis: Secondary | ICD-10-CM

## 2024-01-20 DIAGNOSIS — Z7189 Other specified counseling: Secondary | ICD-10-CM

## 2024-01-20 LAB — LIPID PANEL
Cholesterol: 188 mg/dL (ref 0–200)
HDL: 47.9 mg/dL (ref >39.00)
LDL Cholesterol: 107 mg/dL — ABNORMAL HIGH (ref 0–99)
NonHDL: 139.79
Total CHOL/HDL Ratio: 4
Triglycerides: 166 mg/dL — ABNORMAL HIGH (ref 0.0–149.0)
VLDL: 33.2 mg/dL (ref 0.0–40.0)

## 2024-01-20 LAB — CBC WITH DIFFERENTIAL/PLATELET
Basophils Absolute: 0 10*3/uL (ref 0.0–0.1)
Basophils Relative: 0.7 % (ref 0.0–3.0)
Eosinophils Absolute: 0.1 10*3/uL (ref 0.0–0.7)
Eosinophils Relative: 1.8 % (ref 0.0–5.0)
HCT: 40.2 % (ref 36.0–46.0)
Hemoglobin: 13.6 g/dL (ref 12.0–15.0)
Lymphocytes Relative: 36.9 % (ref 12.0–46.0)
Lymphs Abs: 1.6 10*3/uL (ref 0.7–4.0)
MCHC: 33.7 g/dL (ref 30.0–36.0)
MCV: 85.4 fl (ref 78.0–100.0)
Monocytes Absolute: 0.3 10*3/uL (ref 0.1–1.0)
Monocytes Relative: 7.9 % (ref 3.0–12.0)
Neutro Abs: 2.3 10*3/uL (ref 1.4–7.7)
Neutrophils Relative %: 52.7 % (ref 43.0–77.0)
Platelets: 170 10*3/uL (ref 150.0–400.0)
RBC: 4.7 Mil/uL (ref 3.87–5.11)
RDW: 13.6 % (ref 11.5–15.5)
WBC: 4.3 10*3/uL (ref 4.0–10.5)

## 2024-01-20 LAB — COMPREHENSIVE METABOLIC PANEL WITH GFR
ALT: 14 U/L (ref 0–35)
AST: 22 U/L (ref 0–37)
Albumin: 3.9 g/dL (ref 3.5–5.2)
Alkaline Phosphatase: 67 U/L (ref 39–117)
BUN: 13 mg/dL (ref 6–23)
CO2: 32 meq/L (ref 19–32)
Calcium: 9.3 mg/dL (ref 8.4–10.5)
Chloride: 105 meq/L (ref 96–112)
Creatinine, Ser: 1.17 mg/dL (ref 0.40–1.20)
GFR: 44.9 mL/min — ABNORMAL LOW (ref >60.00)
Glucose, Bld: 124 mg/dL — ABNORMAL HIGH (ref 70–99)
Potassium: 3.5 meq/L (ref 3.5–5.1)
Sodium: 142 meq/L (ref 135–145)
Total Bilirubin: 1.2 mg/dL (ref 0.2–1.2)
Total Protein: 6.4 g/dL (ref 6.0–8.3)

## 2024-01-20 LAB — VITAMIN D 25 HYDROXY (VIT D DEFICIENCY, FRACTURES): VITD: 34.4 ng/mL (ref 30.00–100.00)

## 2024-01-20 LAB — TSH: TSH: 3.58 u[IU]/mL (ref 0.35–5.50)

## 2024-01-20 MED ORDER — PRAVASTATIN SODIUM 40 MG PO TABS: 40.0000 mg | ORAL_TABLET | Freq: Every day | ORAL | 3 refills | Status: AC

## 2024-01-20 MED ORDER — PANTOPRAZOLE SODIUM 40 MG PO TBEC: DELAYED_RELEASE_TABLET | ORAL | 3 refills | Status: AC

## 2024-01-20 MED ORDER — APIXABAN 5 MG PO TABS: 5.0000 mg | ORAL_TABLET | Freq: Two times a day (BID) | ORAL | 12 refills | Status: AC

## 2024-01-20 NOTE — Progress Notes (Unsigned)
 Elevated Cholesterol: Using medications without problems: yes Muscle aches: no Diet compliance: d/w pt.   Exercise: d/w pt.    Migraines.  Better since retirement.  Less frequent episodes in the meantime.  I asked her to update me as needed.  Seasonal allergies.  Some better with flonase .  Using zyrtec.  Both help some. Meds used as needed.    H/o AF.  She doesn't feel skipped beats.  Anticoagulated.  Labs pending.  Compliant.  No CP.  Not SOB.  Not SOB unless sig over exertion.  No syncope.    Osteoporosis.  Intolerant of fosamax .  She has dental work scheduled.  D/w pt about deferring prolia until she is done with dental work.  She can update me as needed.  GERD.  Usually controlled, diet dependent.  No ADE on med.  Compliant.  Rare need for 2nd dose of PPI.    H/o renal stones.  No blood in urine.  No dysuria.    Flu vaccine - done prev.  PNA up to date.   Shingles - d/w pt.   Tetanus 2019 RSV d/w pt.   covid vaccine up to date.   Hep C screening - negative prev, d/w pt.  cologuard 2022, ordered 2025.  Mammogram 2024 DXA 2023, d/w pt about options.  She has dental work scheduled.  D/w pt about deferring prolia until she is done with dental work.   Advance directive- would have husband designated if she were incapacitated.  If husband were incapacitated, then would have daughters Noah Baston and Sherrlyn Dolores equally designated.    Meds, vitals, and allergies reviewed.   ROS: Per HPI unless specifically indicated in ROS section   GEN: nad, alert and oriented HEENT: ncat NECK: supple w/o LA CV: rrr PULM: ctab, no inc wob ABD: soft, +bs EXT: no edema SKIN: well perfused.

## 2024-01-20 NOTE — Patient Instructions (Signed)
 Don't change your meds for now.  Update me as needed.  Go to the lab on the way out.   If you have mychart we'll likely use that to update you.    Take care.  Glad to see you.

## 2024-01-21 DIAGNOSIS — K219 Gastro-esophageal reflux disease without esophagitis: Secondary | ICD-10-CM | POA: Insufficient documentation

## 2024-01-21 NOTE — Assessment & Plan Note (Signed)
 Fortunately better in the meantime.  She can update me as needed.  Less frequent symptoms with retirement.

## 2024-01-21 NOTE — Assessment & Plan Note (Signed)
 Flu vaccine - done prev.  PNA up to date.   Shingles - d/w pt.   Tetanus 2019 RSV d/w pt.   covid vaccine up to date.   Hep C screening - negative prev, d/w pt.  cologuard 2022, ordered 2025.  Mammogram 2024 DXA 2023, d/w pt about options.  She has dental work scheduled.  D/w pt about deferring prolia until she is done with dental work.   Advance directive- would have husband designated if she were incapacitated.  If husband were incapacitated, then would have daughters Rebecca Moran and Rebecca Moran equally designated.

## 2024-01-21 NOTE — Assessment & Plan Note (Signed)
 Usually controlled, diet dependent.  No ADE on med.  Compliant.  Rare need for 2nd dose of PPI.   Continue as is.  She agrees.

## 2024-01-21 NOTE — Assessment & Plan Note (Signed)
 See notes on labs.  Continue anticoagulation.  Okay for outpatient follow-up.

## 2024-01-21 NOTE — Assessment & Plan Note (Signed)
See notes on labs.  Continue pravastatin.

## 2024-01-21 NOTE — Assessment & Plan Note (Signed)
 Some better with flonase .  Using zyrtec.  Both help some. Meds used as needed.   Continue as is.

## 2024-01-21 NOTE — Assessment & Plan Note (Signed)
Advance directive- would have husband designated if she were incapacitated. If husband were incapacitated, then would have daughters Robbin and Tracy equally designated.   

## 2024-01-21 NOTE — Assessment & Plan Note (Signed)
 Intolerant of fosamax .  She has dental work scheduled.  D/w pt about deferring prolia until she is done with dental work.  She can update me as needed.

## 2024-01-25 ENCOUNTER — Other Ambulatory Visit: Payer: Self-pay | Admitting: Family Medicine

## 2024-01-25 DIAGNOSIS — R739 Hyperglycemia, unspecified: Secondary | ICD-10-CM

## 2024-01-29 LAB — COLOGUARD

## 2024-02-04 ENCOUNTER — Ambulatory Visit: Payer: Self-pay

## 2024-02-06 DIAGNOSIS — Z1211 Encounter for screening for malignant neoplasm of colon: Secondary | ICD-10-CM | POA: Diagnosis not present

## 2024-02-16 LAB — COLOGUARD: COLOGUARD: NEGATIVE

## 2024-06-02 ENCOUNTER — Other Ambulatory Visit: Payer: Self-pay | Admitting: Family Medicine

## 2024-06-02 DIAGNOSIS — Z1231 Encounter for screening mammogram for malignant neoplasm of breast: Secondary | ICD-10-CM

## 2024-06-30 ENCOUNTER — Encounter

## 2024-07-15 ENCOUNTER — Encounter

## 2024-07-15 DIAGNOSIS — R0602 Shortness of breath: Secondary | ICD-10-CM | POA: Diagnosis not present

## 2024-07-15 DIAGNOSIS — I480 Paroxysmal atrial fibrillation: Secondary | ICD-10-CM | POA: Diagnosis not present

## 2024-07-15 DIAGNOSIS — R0789 Other chest pain: Secondary | ICD-10-CM | POA: Diagnosis not present

## 2024-07-20 ENCOUNTER — Ambulatory Visit
Admission: RE | Admit: 2024-07-20 | Discharge: 2024-07-20 | Disposition: A | Discharge status: Home / Self Care | Source: Ambulatory Visit | Attending: Family Medicine | Admitting: Family Medicine

## 2024-07-20 DIAGNOSIS — Z1231 Encounter for screening mammogram for malignant neoplasm of breast: Secondary | ICD-10-CM | POA: Diagnosis present

## 2024-07-23 ENCOUNTER — Ambulatory Visit: Payer: Self-pay | Admitting: Family Medicine

## 2024-08-09 ENCOUNTER — Ambulatory Visit: Payer: Self-pay

## 2024-08-09 NOTE — Telephone Encounter (Signed)
 Thank you for getting patient scheduled.

## 2024-08-09 NOTE — Telephone Encounter (Signed)
 FYI Only or Action Required?: FYI only for provider: appointment scheduled on 08/10/24.  Patient was last seen in primary care on 01/20/2024 by Cleatus Arlyss RAMAN, MD.  Called Nurse Triage reporting Fall.  Symptoms began several weeks ago.  Interventions attempted: Rest, hydration, or home remedies.  Symptoms are: stable.  Triage Disposition: See PCP When Office is Open (Within 3 Days)  Patient/caregiver understands and will follow disposition?: Yes         Copied from CRM #[id number]. Topic: Clinical - Red Word Triage >> Aug 09, 2024  7:45 AM Pinkey ORN wrote: Red Word that prompted transfer to Nurse Triage: Fall >> Aug 09, 2024  7:46 AM Pinkey ORN wrote: Patient states she experienced a fall 3 weeks ago, no bruising but her belly-button is weeping out fluids.  Reason for Disposition  [1] Taking Coumadin (warfarin) or other strong blood thinner AND [2] falling is a recurrent problem  Answer Assessment - Initial Assessment Questions 1. MECHANISM: How did the fall happen?     Was outside tending flower bed -- loss my balance and fell 2. DOMESTIC VIOLENCE AND ELDER ABUSE SCREENING: Did you fall because someone pushed you or tried to hurt you? If Yes, ask: Are you safe now?     N/a 3. ONSET: When did the fall happen? (e.g., minutes, hours, or days ago)     3 weeks ago - 10/23 4. LOCATION: What part of the body hit the ground? (e.g., back, buttocks, head, hips, knees, hands, head, stomach)     Stomach, elbow 5. INJURY: Did you hurt (injure) yourself when you fell? If Yes, ask: What did you injure? Tell me more about this? (e.g., body area; type of injury; pain severity)     Endorsed initially felt sore. 6. PAIN: Is there any pain? If Yes, ask: How bad is the pain? (e.g., Scale 0-10; or none, mild,      Now with abd pain at [employer] and back Currently 5-6/10 10/10 with movement  7. SIZE: For cuts, bruises, or swelling, ask: How large is it? (e.g.,  inches or centimeters)      Denies bruising at the time of fall. Some bruising on elbow/arm. Endorses intermittent abd bloating with eating/drinking. Denies BM or urine changes. Still PO at baseline. 8. PREGNANCY: Is there any chance you are pregnant? When was your last menstrual period?     N/a 9. OTHER SYMPTOMS: Do you have any other symptoms? (e.g., dizziness, fever, weakness; new-onset or worsening).      A few days ago with intermittent fluid from my naval - clear fluid 10. CAUSE: What do you think caused the fall (or falling)? (e.g., dizzy spell, tripped)       tripped  Protocols used: Falls and [facility]

## 2024-08-10 ENCOUNTER — Ambulatory Visit (INDEPENDENT_AMBULATORY_CARE_PROVIDER_SITE_OTHER): Admitting: Family Medicine

## 2024-08-10 ENCOUNTER — Encounter: Payer: Self-pay | Admitting: Family Medicine

## 2024-08-10 VITALS — BP 140/84 | HR 71 | Temp 97.8°F | Ht 65.4 in | Wt 197.0 lb

## 2024-08-10 DIAGNOSIS — N393 Stress incontinence (female) (male): Secondary | ICD-10-CM

## 2024-08-10 DIAGNOSIS — R198 Other specified symptoms and signs involving the digestive system and abdomen: Secondary | ICD-10-CM | POA: Diagnosis not present

## 2024-08-10 DIAGNOSIS — R109 Unspecified abdominal pain: Secondary | ICD-10-CM | POA: Diagnosis not present

## 2024-08-10 NOTE — Progress Notes (Unsigned)
 [facility], then went home.  This was 07/15/24.  She had planted a bed of tulip bulbs. She was covering the box with protective wire and fell.  Hit R elbow but didn't hit her head.  Clemens forward onto her stomach.  She was able to get up on her own.  No LOC.  She finished the work later that day.    R elbow bruising improved with minimal residual skin changes.  Still having abd wall pain and lower back pain.  She has lower back pain that predates the fall.  Noted episodic clear drainage from the navel a few days ago but not today.  Can have B lower back pain episodically.    No FCNAVD.  No blood in urine.  No black or bloody stools.    She has SUI, wearing a pad.  That predates the fall.  D/w pt about trial of kegel exercises.    Taking tylenol  for pain as needed.   Meds, vitals, and allergies reviewed.   ROS: Per HPI unless specifically indicated in ROS section   Umbilicus wnl today, not ttp,no erythema.   B lower rectus sore, similar with B lower back w/o bruising.  RRR with ectopy noted.

## 2024-08-10 NOTE — Patient Instructions (Addendum)
 If you have more discharge then let me know.  Take care.  Glad to see you. The abdominal wall discomfort should get better.   Update me as needed.   Keep taking tylenol  as needed and let me know if that isn't helping.   Try kegel exercises.

## 2024-08-11 DIAGNOSIS — R198 Other specified symptoms and signs involving the digestive system and abdomen: Secondary | ICD-10-CM | POA: Insufficient documentation

## 2024-08-11 DIAGNOSIS — N393 Stress incontinence (female) (male): Secondary | ICD-10-CM | POA: Insufficient documentation

## 2024-08-11 DIAGNOSIS — R109 Unspecified abdominal pain: Secondary | ICD-10-CM | POA: Insufficient documentation

## 2024-08-11 NOTE — Assessment & Plan Note (Signed)
 Improving some per patient report. The abdominal wall discomfort should continue to get better.   Update me as needed.   Keep taking tylenol  as needed and she can let me know if that isn't helping.  She agrees to plan.

## 2024-08-11 NOTE — Assessment & Plan Note (Signed)
 History of.  No discharge today.  No erythema.  No cystic changes.  No hernia noted.  I asked her to update me as needed.

## 2024-08-11 NOTE — Assessment & Plan Note (Signed)
 Discussed with patient about trying Kegel exercises.

## 2024-12-29 ENCOUNTER — Ambulatory Visit
